# Patient Record
Sex: Female | Born: 1995
Health system: Southern US, Community
[De-identification: ages and names within clinical notes are randomized; demographics above are authoritative.]

## PROBLEM LIST (undated history)

## (undated) DIAGNOSIS — F329 Major depressive disorder, single episode, unspecified: Secondary | ICD-10-CM

## (undated) DIAGNOSIS — I10 Essential (primary) hypertension: Secondary | ICD-10-CM

## (undated) DIAGNOSIS — E119 Type 2 diabetes mellitus without complications: Secondary | ICD-10-CM

## (undated) DIAGNOSIS — F419 Anxiety disorder, unspecified: Secondary | ICD-10-CM

## (undated) DIAGNOSIS — F32A Depression, unspecified: Secondary | ICD-10-CM

## (undated) DIAGNOSIS — D649 Anemia, unspecified: Secondary | ICD-10-CM

---
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
## 2017-07-22 DIAGNOSIS — E1065 Type 1 diabetes mellitus with hyperglycemia: Secondary | ICD-10-CM | POA: Diagnosis not present

## 2017-07-22 DIAGNOSIS — E538 Deficiency of other specified B group vitamins: Secondary | ICD-10-CM | POA: Diagnosis not present

## 2017-07-22 DIAGNOSIS — E1029 Type 1 diabetes mellitus with other diabetic kidney complication: Secondary | ICD-10-CM | POA: Diagnosis not present

## 2017-07-22 DIAGNOSIS — E559 Vitamin D deficiency, unspecified: Secondary | ICD-10-CM | POA: Diagnosis not present

## 2017-07-22 DIAGNOSIS — E785 Hyperlipidemia, unspecified: Secondary | ICD-10-CM | POA: Diagnosis not present

## 2017-07-22 DIAGNOSIS — E782 Mixed hyperlipidemia: Secondary | ICD-10-CM | POA: Diagnosis not present

## 2017-07-29 DIAGNOSIS — E1065 Type 1 diabetes mellitus with hyperglycemia: Secondary | ICD-10-CM | POA: Diagnosis not present

## 2017-07-29 DIAGNOSIS — E1029 Type 1 diabetes mellitus with other diabetic kidney complication: Secondary | ICD-10-CM | POA: Diagnosis not present

## 2017-08-29 DIAGNOSIS — E1065 Type 1 diabetes mellitus with hyperglycemia: Secondary | ICD-10-CM | POA: Diagnosis not present

## 2017-08-29 DIAGNOSIS — E785 Hyperlipidemia, unspecified: Secondary | ICD-10-CM | POA: Diagnosis not present

## 2017-08-29 DIAGNOSIS — E1029 Type 1 diabetes mellitus with other diabetic kidney complication: Secondary | ICD-10-CM | POA: Diagnosis not present

## 2017-08-29 DIAGNOSIS — E559 Vitamin D deficiency, unspecified: Secondary | ICD-10-CM | POA: Diagnosis not present

## 2017-09-22 DIAGNOSIS — H5213 Myopia, bilateral: Secondary | ICD-10-CM | POA: Diagnosis not present

## 2017-09-22 DIAGNOSIS — E109 Type 1 diabetes mellitus without complications: Secondary | ICD-10-CM | POA: Diagnosis not present

## 2017-10-31 DIAGNOSIS — E1065 Type 1 diabetes mellitus with hyperglycemia: Secondary | ICD-10-CM | POA: Diagnosis not present

## 2017-10-31 DIAGNOSIS — E1029 Type 1 diabetes mellitus with other diabetic kidney complication: Secondary | ICD-10-CM | POA: Diagnosis not present

## 2017-11-07 DIAGNOSIS — E785 Hyperlipidemia, unspecified: Secondary | ICD-10-CM | POA: Diagnosis not present

## 2017-11-07 DIAGNOSIS — E1065 Type 1 diabetes mellitus with hyperglycemia: Secondary | ICD-10-CM | POA: Diagnosis not present

## 2017-11-07 DIAGNOSIS — E1029 Type 1 diabetes mellitus with other diabetic kidney complication: Secondary | ICD-10-CM | POA: Diagnosis not present

## 2017-11-07 DIAGNOSIS — E559 Vitamin D deficiency, unspecified: Secondary | ICD-10-CM | POA: Diagnosis not present

## 2017-11-11 DIAGNOSIS — F321 Major depressive disorder, single episode, moderate: Secondary | ICD-10-CM | POA: Diagnosis not present

## 2017-11-11 DIAGNOSIS — F4312 Post-traumatic stress disorder, chronic: Secondary | ICD-10-CM | POA: Diagnosis not present

## 2017-11-11 DIAGNOSIS — F411 Generalized anxiety disorder: Secondary | ICD-10-CM | POA: Diagnosis not present

## 2017-11-28 DIAGNOSIS — E1029 Type 1 diabetes mellitus with other diabetic kidney complication: Secondary | ICD-10-CM | POA: Diagnosis not present

## 2017-11-28 DIAGNOSIS — E559 Vitamin D deficiency, unspecified: Secondary | ICD-10-CM | POA: Diagnosis not present

## 2017-11-28 DIAGNOSIS — E1065 Type 1 diabetes mellitus with hyperglycemia: Secondary | ICD-10-CM | POA: Diagnosis not present

## 2017-11-28 DIAGNOSIS — E785 Hyperlipidemia, unspecified: Secondary | ICD-10-CM | POA: Diagnosis not present

## 2017-12-09 DIAGNOSIS — Z113 Encounter for screening for infections with a predominantly sexual mode of transmission: Secondary | ICD-10-CM | POA: Diagnosis not present

## 2017-12-09 DIAGNOSIS — F321 Major depressive disorder, single episode, moderate: Secondary | ICD-10-CM | POA: Diagnosis not present

## 2017-12-09 DIAGNOSIS — Z0001 Encounter for general adult medical examination with abnormal findings: Secondary | ICD-10-CM | POA: Diagnosis not present

## 2017-12-09 DIAGNOSIS — Z23 Encounter for immunization: Secondary | ICD-10-CM | POA: Diagnosis not present

## 2017-12-29 DIAGNOSIS — L700 Acne vulgaris: Secondary | ICD-10-CM | POA: Diagnosis not present

## 2017-12-29 DIAGNOSIS — L309 Dermatitis, unspecified: Secondary | ICD-10-CM | POA: Diagnosis not present

## 2018-01-12 DIAGNOSIS — D649 Anemia, unspecified: Secondary | ICD-10-CM | POA: Diagnosis not present

## 2018-01-12 DIAGNOSIS — E1029 Type 1 diabetes mellitus with other diabetic kidney complication: Secondary | ICD-10-CM | POA: Diagnosis not present

## 2018-01-12 DIAGNOSIS — E559 Vitamin D deficiency, unspecified: Secondary | ICD-10-CM | POA: Diagnosis not present

## 2018-01-12 DIAGNOSIS — E1065 Type 1 diabetes mellitus with hyperglycemia: Secondary | ICD-10-CM | POA: Diagnosis not present

## 2018-01-12 DIAGNOSIS — E785 Hyperlipidemia, unspecified: Secondary | ICD-10-CM | POA: Diagnosis not present

## 2018-02-07 DIAGNOSIS — E1065 Type 1 diabetes mellitus with hyperglycemia: Secondary | ICD-10-CM | POA: Diagnosis not present

## 2018-02-07 DIAGNOSIS — E1029 Type 1 diabetes mellitus with other diabetic kidney complication: Secondary | ICD-10-CM | POA: Diagnosis not present

## 2018-02-07 DIAGNOSIS — E785 Hyperlipidemia, unspecified: Secondary | ICD-10-CM | POA: Diagnosis not present

## 2018-02-07 DIAGNOSIS — E559 Vitamin D deficiency, unspecified: Secondary | ICD-10-CM | POA: Diagnosis not present

## 2018-02-23 DIAGNOSIS — Z01411 Encounter for gynecological examination (general) (routine) with abnormal findings: Secondary | ICD-10-CM | POA: Diagnosis not present

## 2018-02-23 DIAGNOSIS — Z113 Encounter for screening for infections with a predominantly sexual mode of transmission: Secondary | ICD-10-CM | POA: Diagnosis not present

## 2018-02-23 DIAGNOSIS — Z6836 Body mass index (BMI) 36.0-36.9, adult: Secondary | ICD-10-CM | POA: Diagnosis not present

## 2018-02-23 DIAGNOSIS — L293 Anogenital pruritus, unspecified: Secondary | ICD-10-CM | POA: Diagnosis not present

## 2018-07-26 DIAGNOSIS — N898 Other specified noninflammatory disorders of vagina: Secondary | ICD-10-CM | POA: Diagnosis not present

## 2018-07-26 DIAGNOSIS — N760 Acute vaginitis: Secondary | ICD-10-CM | POA: Diagnosis not present

## 2018-07-26 DIAGNOSIS — N926 Irregular menstruation, unspecified: Secondary | ICD-10-CM | POA: Diagnosis not present

## 2018-08-11 DIAGNOSIS — E785 Hyperlipidemia, unspecified: Secondary | ICD-10-CM | POA: Diagnosis not present

## 2018-08-11 DIAGNOSIS — E559 Vitamin D deficiency, unspecified: Secondary | ICD-10-CM | POA: Diagnosis not present

## 2018-08-11 DIAGNOSIS — E1029 Type 1 diabetes mellitus with other diabetic kidney complication: Secondary | ICD-10-CM | POA: Diagnosis not present

## 2018-08-11 DIAGNOSIS — E1065 Type 1 diabetes mellitus with hyperglycemia: Secondary | ICD-10-CM | POA: Diagnosis not present

## 2018-08-17 ENCOUNTER — Ambulatory Visit: Payer: Self-pay | Admitting: Nurse Practitioner

## 2018-08-17 VITALS — BP 118/78 | HR 97 | Temp 98.3°F | Resp 20 | Wt 186.6 lb

## 2018-08-17 DIAGNOSIS — H6592 Unspecified nonsuppurative otitis media, left ear: Secondary | ICD-10-CM

## 2018-08-17 MED ORDER — AMOXICILLIN 875 MG PO TABS: 875.0000 mg | ORAL_TABLET | Freq: Two times a day (BID) | ORAL | 0 refills | Status: AC

## 2018-08-17 MED ORDER — FLUTICASONE PROPIONATE 50 MCG/ACT NA SUSP: 2.0000 | Freq: Every day | NASAL | 0 refills | Status: AC

## 2018-08-17 NOTE — Progress Notes (Signed)
Subjective:    Patient ID: Zoe Fox, female    DOB: 1995/09/05, 23 y.o.   MRN: [id number]  The patient is a 23 year old female who presents today for complaints of left ear pain.  Patient states her symptoms started this morning when she woke up.  Patient states that she has been sick with a cold to include coughing and nasal congestion for about 2 weeks prior.  Patient states she has been taking Mucinex for her cold symptoms and cough which have seemed to help.  The patient denies fever, chills, sore throat, loss of hearing, or change in hearing.  Patient does admit to "pressure" in the left ear and feeling like her ears need to pop, along with decreased appetite.  Patient states she has been drinking plenty of fluids today.  Patient denies that she could be hyperglycemic at this time as she normally urinates when this occurs..  The patient rates the current left ear pain 9/10 at present.  Patient is a diabetic, but does not recall her last A1c as she states she just went to the doctor and she has not received her lab work back yet.  Patient states that she cannot recall her last A1c prior to this date as it is been a while as the office kept canceling her appointment.  Patient states her last blood sugar that she took on yesterday was 270.  Otalgia   There is pain in the left ear. This is a new problem. The current episode started today. The problem occurs constantly. There has been no fever. The pain is at a severity of 9/10. The pain is severe. Associated symptoms include coughing. Pertinent negatives include no abdominal pain, ear discharge, headaches, hearing loss, rhinorrhea or sore throat. She has tried nothing for the symptoms. history of recurrent ear infections as a child   No past medical history on file.    Review of Systems  Constitutional: Positive for appetite change and fatigue. Negative for activity change, chills, diaphoresis and fever.  HENT: Positive for congestion and ear  pain. Negative for ear discharge, hearing loss, postnasal drip, rhinorrhea, sinus pressure, sinus pain and sore throat.   Eyes: Negative.   Respiratory: Positive for cough. Negative for shortness of breath, wheezing and stridor.   Cardiovascular: Negative.   Gastrointestinal: Negative for abdominal pain.  Skin: Negative.   Allergic/Immunologic: Negative.   Neurological: Negative for dizziness, seizures, light-headedness, numbness and headaches.       Objective: Blood pressure 118/78, pulse 97, temperature 98.3 F (36.8 C), temperature source Oral, resp. rate 20, weight 186 lb 9.6 oz (84.6 kg), SpO2 98 %.   Physical Exam Constitutional:      Appearance: Normal appearance.     Comments: Appears uncomfortable due to left ear pain  HENT:     Head: Normocephalic.     Right Ear: Ear canal and external ear normal.     Left Ear: Hearing, ear canal and external ear normal. A middle ear effusion is present. Tympanic membrane is injected, erythematous and bulging.     Ears:     Comments: Left middle ear effusion with pus apppearing fluid, + erythematous TM Skin:    Findings: Rash: illin.  Neurological:     Mental Status: She is alert.       Assessment & Plan:   Exam findings, diagnosis etiology and medication use and indications reviewed with patient. Follow- Up and discharge instructions provided. No emergent/urgent issues found on exam.  Based on the  patient's clinical presentation, symptoms, and physical assessment, patient's findings were consistent with that of a left suppurative otitis media.  Patient had pus appearing fluid in the middle left ear, with extreme pain.  Also provided the patient with Flonase to help with her middle ear effusion.  Patient was also instructed to follow-up with her primary care for better management of her diabetes.  The patient education was provided. Patient verbalized understanding of information provided and agrees with plan of care (POC), all questions  answered. The patient is advised to call or return to clinic if condition does not see an improvement in symptoms, or to seek the care of the closest emergency department if condition worsens with the above plan.   1. Left otitis media with effusion  - amoxicillin (AMOXIL) 875 MG tablet; Take 1 tablet (875 mg total) by mouth 2 (two) times daily for 10 days.  Dispense: 20 tablet; Refill: 0 - fluticasone (FLONASE) 50 MCG/ACT nasal spray; Place 2 sprays into both nostrils daily for 10 days.  Dispense: 16 g; Refill: 0 -Take medications as prescribed. -Ibuprofen or Tylenol for pain, fever, or general discomfort. -Warm compresses to the left ear for pain or discomfort. -Follow-up in the emergency department if you have loss of hearing, change of hearing, or other concerns. -Follow-up with your regular doctor if you are not improved in the next 3 to 5 days. -Follow-up with your regular doctor to keep a check on your diabetes management.

## 2018-08-17 NOTE — Patient Instructions (Signed)
Otitis Media, Adult -Take medications as prescribed. -Ibuprofen or Tylenol for pain, fever, or general discomfort. -Warm compresses to the left ear for pain or discomfort. -Follow-up in the emergency department if you have loss of hearing, change of hearing, or other concerns. -Follow-up with your regular doctor if you are not improved in the next 3 to 5 days. -Follow-up with your regular doctor to keep a check on your diabetes management.  Otitis media occurs when there is inflammation and fluid in the middle ear. Your middle ear is a part of the ear that contains bones for hearing as well as air that helps send sounds to your brain. What are the causes? This condition is caused by a blockage in the eustachian tube. This tube drains fluid from the ear to the back of the nose (nasopharynx). A blockage in this tube can be caused by an object or by swelling (edema) in the tube. Problems that can cause a blockage include:  A cold or other upper respiratory infection.  Allergies.  An irritant, such as tobacco smoke.  Enlarged adenoids. The adenoids are areas of soft tissue located high in the back of the throat, behind the nose and the roof of the mouth.  A mass in the nasopharynx.  Damage to the ear caused by pressure changes (barotrauma). What are the signs or symptoms? Symptoms of this condition include:  Ear pain.  A fever.  Decreased hearing.  A headache.  Tiredness (lethargy).  Fluid leaking from the ear.  Ringing in the ear. How is this diagnosed? This condition is diagnosed with a physical exam. During the exam your health care provider will use an instrument called an otoscope to look into your ear and check for redness, swelling, and fluid. He or she will also ask about your symptoms. Your health care provider may also order tests, such as:  A test to check the movement of the eardrum (pneumatic otoscopy). This test is done by squeezing a small amount of air into the  ear.  A test that changes air pressure in the middle ear to check how well the eardrum moves and whether the eustachian tube is working (tympanogram). How is this treated? This condition usually goes away on its own within 3-5 days. But if the condition is caused by a bacteria infection and does not go away own its own, or keeps coming back, your health care provider may:  Prescribe antibiotic medicines to treat the infection.  Prescribe or recommend medicines to control pain. Follow these instructions at home:  Take over-the-counter and prescription medicines only as told by your health care provider.  If you were prescribed an antibiotic medicine, take it as told by your health care provider. Do not stop taking the antibiotic even if you start to feel better.  Keep all follow-up visits as told by your health care provider. This is important. Contact a health care provider if:  You have bleeding from your nose.  There is a lump on your neck.  You are not getting better in 5 days.  You feel worse instead of better. Get help right away if:  You have severe pain that is not controlled with medicine.  You have swelling, redness, or pain around your ear.  You have stiffness in your neck.  A part of your face is paralyzed.  The bone behind your ear (mastoid) is tender when you touch it.  You develop a severe headache. Summary  Otitis media is redness, soreness, and  swelling of the middle ear.  This condition usually goes away on its own within 3-5 days.  If the problem does not go away in 3-5 days, your health care provider may prescribe or recommend medicines to treat your symptoms.  If you were prescribed an antibiotic medicine, take it as told by your health care provider. This information is not intended to replace advice given to you by your health care provider. Make sure you discuss any questions you have with your health care provider. Document Released: 04/09/2004  Document Revised: 06/25/2016 Document Reviewed: 06/25/2016 Elsevier Interactive Patient Education  2019 [employer].

## 2019-03-08 DIAGNOSIS — Z01419 Encounter for gynecological examination (general) (routine) without abnormal findings: Secondary | ICD-10-CM | POA: Diagnosis not present

## 2019-03-08 DIAGNOSIS — Z6836 Body mass index (BMI) 36.0-36.9, adult: Secondary | ICD-10-CM | POA: Diagnosis not present

## 2019-06-27 ENCOUNTER — Inpatient Hospital Stay (HOSPITAL_COMMUNITY)
Admission: AD | Admit: 2019-06-27 | Discharge: 2019-06-27 | Disposition: A | Discharge status: Home / Self Care | Payer: BC Managed Care – PPO | Attending: Obstetrics and Gynecology | Admitting: Obstetrics and Gynecology

## 2019-06-27 ENCOUNTER — Inpatient Hospital Stay (HOSPITAL_COMMUNITY): Payer: BC Managed Care – PPO

## 2019-06-27 ENCOUNTER — Encounter (HOSPITAL_COMMUNITY): Payer: Self-pay

## 2019-06-27 ENCOUNTER — Other Ambulatory Visit: Payer: Self-pay

## 2019-06-27 DIAGNOSIS — Z3A01 Less than 8 weeks gestation of pregnancy: Secondary | ICD-10-CM | POA: Insufficient documentation

## 2019-06-27 DIAGNOSIS — Z794 Long term (current) use of insulin: Secondary | ICD-10-CM | POA: Insufficient documentation

## 2019-06-27 DIAGNOSIS — O4691 Antepartum hemorrhage, unspecified, first trimester: Secondary | ICD-10-CM | POA: Insufficient documentation

## 2019-06-27 DIAGNOSIS — E109 Type 1 diabetes mellitus without complications: Secondary | ICD-10-CM | POA: Diagnosis present

## 2019-06-27 DIAGNOSIS — O161 Unspecified maternal hypertension, first trimester: Secondary | ICD-10-CM | POA: Diagnosis not present

## 2019-06-27 DIAGNOSIS — E1065 Type 1 diabetes mellitus with hyperglycemia: Secondary | ICD-10-CM | POA: Diagnosis not present

## 2019-06-27 DIAGNOSIS — O3680X0 Pregnancy with inconclusive fetal viability, not applicable or unspecified: Secondary | ICD-10-CM | POA: Diagnosis not present

## 2019-06-27 DIAGNOSIS — O4690 Antepartum hemorrhage, unspecified, unspecified trimester: Secondary | ICD-10-CM

## 2019-06-27 DIAGNOSIS — O24011 Pre-existing diabetes mellitus, type 1, in pregnancy, first trimester: Secondary | ICD-10-CM | POA: Diagnosis not present

## 2019-06-27 DIAGNOSIS — Z79899 Other long term (current) drug therapy: Secondary | ICD-10-CM | POA: Insufficient documentation

## 2019-06-27 DIAGNOSIS — Z6790 Unspecified blood type, Rh positive: Secondary | ICD-10-CM | POA: Insufficient documentation

## 2019-06-27 DIAGNOSIS — O209 Hemorrhage in early pregnancy, unspecified: Secondary | ICD-10-CM | POA: Diagnosis not present

## 2019-06-27 HISTORY — DX: Anxiety disorder, unspecified: F41.9

## 2019-06-27 HISTORY — DX: Depression, unspecified: F32.A

## 2019-06-27 HISTORY — DX: Type 1 diabetes mellitus without complications: E10.9

## 2019-06-27 HISTORY — DX: Essential (primary) hypertension: I10

## 2019-06-27 HISTORY — DX: Anemia, unspecified: D64.9

## 2019-06-27 LAB — CBC WITH DIFFERENTIAL/PLATELET
Abs Immature Granulocytes: 0.01 10*3/uL (ref 0.00–0.07)
Basophils Absolute: 0 10*3/uL (ref 0.0–0.1)
Basophils Relative: 0 %
Eosinophils Absolute: 0 10*3/uL (ref 0.0–0.5)
Eosinophils Relative: 1 %
HCT: 36.1 % (ref 36.0–46.0)
Hemoglobin: 11.8 g/dL — ABNORMAL LOW (ref 12.0–15.0)
Immature Granulocytes: 0 %
Lymphocytes Relative: 31 %
Lymphs Abs: 2.2 10*3/uL (ref 0.7–4.0)
MCH: 25.3 pg — ABNORMAL LOW (ref 26.0–34.0)
MCHC: 32.7 g/dL (ref 30.0–36.0)
MCV: 77.3 fL — ABNORMAL LOW (ref 80.0–100.0)
Monocytes Absolute: 0.4 10*3/uL (ref 0.1–1.0)
Monocytes Relative: 5 %
Neutro Abs: 4.5 10*3/uL (ref 1.7–7.7)
Neutrophils Relative %: 63 %
Platelets: 377 10*3/uL (ref 150–400)
RBC: 4.67 MIL/uL (ref 3.87–5.11)
RDW: 12.9 % (ref 11.5–15.5)
WBC: 7.1 10*3/uL (ref 4.0–10.5)
nRBC: 0 % (ref 0.0–0.2)

## 2019-06-27 LAB — URINALYSIS, ROUTINE W REFLEX MICROSCOPIC
Bacteria, UA: NONE SEEN
Bilirubin Urine: NEGATIVE
Glucose, UA: >=500 mg/dL — AB
Ketones, ur: 20 mg/dL — AB
Leukocytes,Ua: NEGATIVE
Nitrite: NEGATIVE
Protein, ur: NEGATIVE mg/dL
Specific Gravity, Urine: 1.031 — ABNORMAL HIGH (ref 1.005–1.030)
pH: 6 (ref 5.0–8.0)

## 2019-06-27 LAB — COMPREHENSIVE METABOLIC PANEL
ALT: 13 U/L (ref 0–44)
AST: 12 U/L — ABNORMAL LOW (ref 15–41)
Albumin: 3.8 g/dL (ref 3.5–5.0)
Alkaline Phosphatase: 91 U/L (ref 38–126)
Anion gap: 15 (ref 5–15)
BUN: 8 mg/dL (ref 6–20)
CO2: 21 mmol/L — ABNORMAL LOW (ref 22–32)
Calcium: 9.1 mg/dL (ref 8.9–10.3)
Chloride: 98 mmol/L (ref 98–111)
Creatinine, Ser: 0.57 mg/dL (ref 0.44–1.00)
GFR calc Af Amer: >60 mL/min (ref >60)
GFR calc non Af Amer: >60 mL/min (ref >60)
Glucose, Bld: 529 mg/dL (ref 70–99)
Potassium: 3.5 mmol/L (ref 3.5–5.1)
Sodium: 134 mmol/L — ABNORMAL LOW (ref 135–145)
Total Bilirubin: 1 mg/dL (ref 0.3–1.2)
Total Protein: 7.1 g/dL (ref 6.5–8.1)

## 2019-06-27 LAB — ABO/RH: ABO/RH(D): O POS

## 2019-06-27 LAB — WET PREP, GENITAL
Clue Cells Wet Prep HPF POC: NONE SEEN
Sperm: NONE SEEN
Trich, Wet Prep: NONE SEEN
Yeast Wet Prep HPF POC: NONE SEEN

## 2019-06-27 LAB — GLUCOSE, CAPILLARY
Glucose-Capillary: 518 mg/dL (ref 70–99)
Glucose-Capillary: 396 mg/dL — ABNORMAL HIGH (ref 70–99)
Glucose-Capillary: 288 mg/dL — ABNORMAL HIGH (ref 70–99)

## 2019-06-27 LAB — POCT PREGNANCY, URINE: Preg Test, Ur: POSITIVE — AB

## 2019-06-27 LAB — HCG, QUANTITATIVE, PREGNANCY: hCG, Beta Chain, Quant, S: 481 m[IU]/mL — ABNORMAL HIGH (ref <5)

## 2019-06-27 MED ORDER — SODIUM CHLORIDE 0.9 % IV BOLUS
1000.0000 mL | Freq: Once | INTRAVENOUS | Status: AC
  Administered 2019-06-27: 1000 mL via INTRAVENOUS

## 2019-06-27 MED ORDER — INSULIN ASPART 100 UNIT/ML ~~LOC~~ SOLN
10.0000 [IU] | Freq: Once | SUBCUTANEOUS | Status: AC
  Administered 2019-06-27: 10 [IU] via SUBCUTANEOUS

## 2019-06-27 NOTE — MAU Provider Note (Signed)
History     CSN: [id number]  Arrival date and time: 06/27/19 1150   First Provider Initiated Contact with Patient 06/27/19 1235      Chief Complaint  Patient presents with  . Vaginal Bleeding   Ms. Zoe Fox is a 23 y.o. G1P0 at [redacted]w[redacted]d who presents to MAU for vaginal bleeding which began yesterday. Pt reports yesterday the bleeding was just pink spotting, but this morning is was "a little bit heavier than what it was with a brightish red tint to it."  Passing blood clots? no Blood soaking clothes? no Lightheaded/dizzy? no Significant pelvic pain or cramping? mild cramping Passed any tissue? no  Current pregnancy problems? pt has not yet been seen, Type I DM Blood Type? unknown Allergies? NKDA Current medications? Novolog, [place] Current [facility] & next appt? Pt does not have an OB/GYN in [facility]  Pt denies vaginal discharge/odor/itching. Pt denies N/V, abdominal pain, constipation, diarrhea, or urinary problems. Pt denies fever, chills, fatigue, sweating or changes in appetite. Pt denies SOB or chest pain. Pt denies dizziness, HA, light-headedness, weakness.   OB History    Gravida  1   Para      Term      Preterm      AB      Living        SAB      TAB      Ectopic      Multiple      Live Births              Past Medical History:  Diagnosis Date  . Anemia   . Anxiety   . Depression   . Diabetes mellitus without complication (HCC)   . Hypertension     History reviewed. No pertinent surgical history.  Family History  Problem Relation Age of Onset  . Hypertension Mother   . Cancer Maternal Grandfather     Social History   Tobacco Use  . Smoking status: Never Smoker  . Smokeless tobacco: Never Used  Substance Use Topics  . Alcohol use: Never    Frequency: Never  . Drug use: Never    Allergies: No Known Allergies  Medications Prior to Admission  Medication Sig Dispense Refill Last Dose  . insulin aspart (NOVOLOG) 100 UNIT/ML  injection Inject into the skin 3 (three) times daily before meals.   06/26/2019 at Unknown time  . insulin glargine (LANTUS) 100 UNIT/ML injection Inject into the skin daily.   06/26/2019 at Unknown time  . fluticasone (FLONASE) 50 MCG/ACT nasal spray Place 2 sprays into both nostrils daily for 10 days. 16 g 0   . insulin lispro (HUMALOG) 100 UNIT/ML injection Inject into the skin 3 (three) times daily before meals.     . vitamin A 40981 UNIT capsule Take 10,000 Units by mouth daily.       Review of Systems  Constitutional: Negative for chills, diaphoresis, fatigue and fever.  Eyes: Negative for visual disturbance.  Respiratory: Negative for shortness of breath.   Cardiovascular: Negative for chest pain.  Gastrointestinal: Negative for abdominal pain, constipation, diarrhea, nausea and vomiting.  Genitourinary: Positive for pelvic pain and vaginal bleeding. Negative for dysuria, flank pain, frequency, urgency and vaginal discharge.  Neurological: Negative for dizziness, weakness, light-headedness and headaches.   Physical Exam   Blood pressure (!) 141/89, pulse 96, temperature 98.6 F (37 C), temperature source Oral, resp. rate 16, height  (1.575 m), weight 67 kg, last menstrual period 05/17/2019, SpO2 100 %.  Patient Vitals for the past 24 hrs:  BP Temp Temp src Pulse Resp SpO2 Height Weight  06/27/19 1225 (!) 141/89 - - 96 - - - -  06/27/19 1203 (!) 156/100 98.6 F (37 C) Oral (!) 113 16 100 % 5\' 2"  (1.575 m) 67 kg   Physical Exam  Constitutional: She is oriented to person, place, and time. She appears well-developed and well-nourished. No distress.  HENT:  Head: Normocephalic and atraumatic.  Respiratory: Effort normal.  GI: Soft. She exhibits no distension and no mass. There is no abdominal tenderness. There is no rebound and no guarding.  Genitourinary: There is no rash, tenderness or lesion on the right labia. There is no rash, tenderness or lesion on the left labia. Uterus  is not enlarged and not tender. Cervix exhibits no motion tenderness, no discharge and no friability. Right adnexum displays no mass, no tenderness and no fullness. Left adnexum displays no mass, no tenderness and no fullness.    Vaginal bleeding (minimal blood present in vault, easily removed with single Fox swab, minimal blood comng from external os) present.     No vaginal discharge or tenderness.  There is bleeding (minimal blood present in vault, easily removed with single Fox swab, minimal blood comng from external os) in the vagina. No tenderness in the vagina.    Genitourinary Comments: Cervix closed.   Neurological: She is alert and oriented to person, place, and time.  Skin: Skin is warm and dry. She is not diaphoretic.  Psychiatric: She has a normal mood and affect. Her behavior is normal. Judgment and thought content normal.   Results for orders placed or performed during the hospital encounter of 06/27/19 (from the past 24 hour(s))  Urinalysis, Routine w reflex microscopic     Status: Abnormal   Collection Time: 06/27/19 12:10 PM  Result Value Ref Range   Color, Urine COLORLESS (A) YELLOW   APPearance CLEAR CLEAR   Specific Gravity, Urine 1.031 (H) 1.005 - 1.030   pH 6.0 5.0 - 8.0   Glucose, UA >=500 (A) NEGATIVE mg/dL   Hgb urine dipstick SMALL (A) NEGATIVE   Bilirubin Urine NEGATIVE NEGATIVE   Ketones, ur 20 (A) NEGATIVE mg/dL   Protein, ur NEGATIVE NEGATIVE mg/dL   Nitrite NEGATIVE NEGATIVE   Leukocytes,Ua NEGATIVE NEGATIVE   Bacteria, UA NONE SEEN NONE SEEN   Squamous Epithelial / LPF 0-5 0 - 5  Pregnancy, urine POC     Status: Abnormal   Collection Time: 06/27/19 12:12 PM  Result Value Ref Range   Preg Test, Ur POSITIVE (A) NEGATIVE  CBC with Differential/Platelet     Status: Abnormal   Collection Time: 06/27/19 12:49 PM  Result Value Ref Range   WBC 7.1 4.0 - 10.5 K/uL   RBC 4.67 3.87 - 5.11 MIL/uL   Hemoglobin 11.8 (L) 12.0 - 15.0 g/dL   HCT 14/09/20 16.0 - 10.9 %    MCV 77.3 (L) 80.0 - 100.0 fL   MCH 25.3 (L) 26.0 - 34.0 pg   MCHC 32.7 30.0 - 36.0 g/dL   RDW 32.3 55.7 - 32.2 %   Platelets 377 150 - 400 K/uL   nRBC 0.0 0.0 - 0.2 %   Neutrophils Relative % 63 %   Neutro Abs 4.5 1.7 - 7.7 K/uL   Lymphocytes Relative 31 %   Lymphs Abs 2.2 0.7 - 4.0 K/uL   Monocytes Relative 5 %   Monocytes Absolute 0.4 0.1 - 1.0 K/uL   Eosinophils Relative 1 %  Eosinophils Absolute 0.0 0.0 - 0.5 K/uL   Basophils Relative 0 %   Basophils Absolute 0.0 0.0 - 0.1 K/uL   Immature Granulocytes 0 %   Abs Immature Granulocytes 0.01 0.00 - 0.07 K/uL  Comprehensive metabolic panel     Status: Abnormal   Collection Time: 06/27/19 12:49 PM  Result Value Ref Range   Sodium 134 (L) 135 - 145 mmol/L   Potassium 3.5 3.5 - 5.1 mmol/L   Chloride 98 98 - 111 mmol/L   CO2 21 (L) 22 - 32 mmol/L   Glucose, Bld 529 (HH) 70 - 99 mg/dL   BUN 8 6 - 20 mg/dL   Creatinine, Ser 1.61 0.44 - 1.00 mg/dL   Calcium 9.1 8.9 - 09.6 mg/dL   Total Protein 7.1 6.5 - 8.1 g/dL   Albumin 3.8 3.5 - 5.0 g/dL   AST 12 (L) 15 - 41 U/L   ALT 13 0 - 44 U/L   Alkaline Phosphatase 91 38 - 126 U/L   Total Bilirubin 1.0 0.3 - 1.2 mg/dL   GFR calc non Af Amer >60 >60 mL/min   GFR calc Af Amer >60 >60 mL/min   Anion gap 15 5 - 15  ABO/Rh     Status: None   Collection Time: 06/27/19 12:49 PM  Result Value Ref Range   ABO/RH(D) O POS    No rh immune globuloin      NOT A RH IMMUNE GLOBULIN CANDIDATE, PT RH POSITIVE Performed at [facility], [address] [place], [place] [address]   hCG, quantitative, pregnancy     Status: Abnormal   Collection Time: 06/27/19 12:49 PM  Result Value Ref Range   hCG, Beta Chain, Quant, S 481 (H) <5 mIU/mL  Wet prep, genital     Status: Abnormal   Collection Time: 06/27/19 12:50 PM   Specimen: Vaginal  Result Value Ref Range   Yeast Wet Prep HPF POC NONE SEEN NONE SEEN   Trich, Wet Prep NONE SEEN NONE SEEN   Clue Cells Wet Prep HPF POC NONE SEEN NONE SEEN    WBC, Wet Prep HPF POC MANY (A) NONE SEEN   Sperm NONE SEEN   Glucose, capillary     Status: Abnormal   Collection Time: 06/27/19 12:57 PM  Result Value Ref Range   Glucose-Capillary 518 (HH) 70 - 99 mg/dL   Comment 1 Notify RN   Glucose, capillary     Status: Abnormal   Collection Time: 06/27/19  2:12 PM  Result Value Ref Range   Glucose-Capillary 396 (H) 70 - 99 mg/dL  Glucose, capillary     Status: Abnormal   Collection Time: 06/27/19  3:02 PM  Result Value Ref Range   Glucose-Capillary 288 (H) 70 - 99 mg/dL   US Ob Less Than 14 Weeks With Ob Transvaginal  Result Date: 06/27/2019 CLINICAL DATA:  First trimester pregnancy with vaginal bleeding. Positive urine pregnancy test. Quantitative beta HCG level pending. LMP 05/17/2019. EXAM: OBSTETRIC <14 WK [place] AND TRANSVAGINAL OB US TECHNIQUE: Both transabdominal and transvaginal ultrasound examinations were performed for complete evaluation of the gestation as well as the maternal uterus, adnexal regions, and pelvic cul-de-sac. Transvaginal technique was performed to assess early pregnancy. COMPARISON:  None. FINDINGS: Intrauterine gestational sac: Small cystic area within the endometrial canal likely represents a gestational sac. This measures 2.5 x 2.5 x 3.8 cm. Yolk sac:  Probable small yolk sac. Embryo:  Not visualized. Cardiac Activity: Not visualized. MSD: 2.9 mm  4 w   6 d Subchorionic hemorrhage:  None visualized. Maternal uterus/adnexae: The right ovary is not visualized. Small left corpus luteal and simple left ovarian cysts. Trace free pelvic fluid. IMPRESSION: 1. Probable early intrauterine gestational sac with a yolk sac but no fetal pole or cardiac activity yet visualized to confirm viability. Recommend follow-up quantitative B-HCG levels and follow-up US in 14 days to assess viability. This recommendation follows SRU consensus guidelines: Diagnostic Criteria for Nonviable Pregnancy Early in the First Trimester. [place] Med 2013;  151:7616-07. 2. No apparent acute findings. Electronically Signed   By: Richardean Sale M.D.   On: 06/27/2019 14:04    MAU Course  Procedures  MDM -r/o ectopic -CBG: 518 - ordered as patient is Type I DM and reported nothing to eat since yesterday; Novolog 10U given, recheck 396 -elevated pressure, pt denies symptoms and denies hx of HTN, but states that her BP is elevated when she gets nervous and she expresses high levels of anxiety about the pregnancy today -UA: colorless/SG1.031/>500GLU/sm hgb/20ketones -CBC: WNL for pregnancy -CMP: NA 134, glucose 529 -[place]: possible gestational sac, possible yolk sac, no embryo, [redacted]w[redacted]d, trace pelvic free fluid -hCG: 481 -ABO: O Positive -WetPrep: WNL -GC/CT collected -consulted with Dr. Elly Modena @218PM . Pt to have another 10U Novolog, eat and give a bolus of normal saline. Pt also to be advised on risks of fetal malformation and increased risk of miscarriage in first trimester with elevated blood sugar. -recheck of BS 53min after 2nd injection of Novolog and eating, 288 -consulted with Dr. Elly Modena @306PM , pt OK to be discharged home and resume original insulin regimen. -pt discharged to home in stable condition  Orders Placed This Encounter  Procedures  . Wet prep, genital    Standing Status:   Standing    Number of Occurrences:   1  . US OB LESS THAN 14 WEEKS WITH OB TRANSVAGINAL    Standing Status:   Standing    Number of Occurrences:   1    Order Specific Question:   Symptom/Reason for Exam    Answer:   Vaginal bleeding in pregnancy [371062]  . Urinalysis, Routine w reflex microscopic    Standing Status:   Standing    Number of Occurrences:   1  . CBC with Differential/Platelet    Standing Status:   Standing    Number of Occurrences:   1  . Comprehensive metabolic panel    Standing Status:   Standing    Number of Occurrences:   1  . Glucose, capillary    Standing Status:   Standing    Number of Occurrences:   1  . hCG, quantitative,  pregnancy    Standing Status:   Standing    Number of Occurrences:   1  . Glucose, capillary    Standing Status:   Standing    Number of Occurrences:   1  . Glucose, capillary    Standing Status:   Standing    Number of Occurrences:   1  . Ambulatory referral to Endocrinology    Referral Priority:   Urgent    Referral Type:   Consultation    Referral Reason:   Specialty Services Required    Number of Visits Requested:   1  . Pregnancy, urine POC    Standing Status:   Standing    Number of Occurrences:   1  . ABO/Rh    Standing Status:   Standing    Number of  Occurrences:   1  . Insert peripheral IV    Standing Status:   Standing    Number of Occurrences:   1  . Discharge patient    Order Specific Question:   Discharge disposition    Answer:   01-Home or Self Care [1]    Order Specific Question:   Discharge patient date    Answer:   06/27/2019   Assessment and Plan   1. Pregnancy of unknown anatomic location   2. Vaginal bleeding in pregnancy   3. Blood type, Rh positive   4. Uncontrolled type 1 diabetes mellitus with hyperglycemia (HCC)   5. Type 1 diabetes mellitus with hyperglycemia (HCC)    Allergies as of 06/27/2019   No Known Allergies     Medication List    TAKE these medications   fluticasone 50 MCG/ACT nasal spray Commonly known as: FLONASE Place 2 sprays into both nostrils daily for 10 days.   insulin aspart 100 UNIT/ML injection Commonly known as: novoLOG Inject into the skin 3 (three) times daily before meals.   insulin glargine 100 UNIT/ML injection Commonly known as: LANTUS Inject into the skin daily.   insulin lispro 100 UNIT/ML injection Commonly known as: HUMALOG Inject into the skin 3 (three) times daily before meals.   vitamin A 1610910000 UNIT capsule Take 10,000 Units by mouth daily.      -will call with culture results, if positive -pt to call endo re: pregnancy to discuss possible change in management for Type I DM; referral entered for  outpatient endocrinology -discussed possible complications in pregnancy with elevated blood sugar -pt advised to return to insulin regimen as previously prescribed and to not skip meals to adequately control blood sugar -pt advised on risks of fetal malformation and increased risk of miscarriage in first trimester with elevated blood sugar. -message sent to [facility] to schedule for stat hCG on Friday 06/29/2019 AM -strict ectopic/bleeding/pain/return MAU precautions given -pt discharged to home in stable condition  Joni Reiningicole E  06/27/2019, 3:22 PM

## 2019-06-27 NOTE — MAU Note (Signed)
.   Zoe Fox is a 23 y.o. at [redacted]w[redacted]d here in MAU reporting: vaginal bleeding that started yesterday. It has increased into period-like bright red bleeding, but does not fill a pad. She states that she has cramping in her back when standing or sitting.  LMP: 05/17/2019 Onset of complaint: 06/26/19 @1100  Pain score: 4 Vitals:   06/27/19 1203  BP: (!) 156/100  Pulse: (!) 113  Resp: 16  Temp: 98.6 F (37 C)  SpO2: 100%

## 2019-06-27 NOTE — Discharge Instructions (Signed)
Miscarriage °A miscarriage is the loss of an unborn baby (fetus) before the 20th week of pregnancy. Most miscarriages happen during the first 3 months of pregnancy. Sometimes, a miscarriage can happen before a woman knows that she is pregnant. °Having a miscarriage can be an emotional experience. If you have had a miscarriage, talk with your health care provider about any questions you may have about miscarrying, the grieving process, and your plans for future pregnancy. °What are the causes? °A miscarriage may be caused by: °· Problems with the genes or chromosomes of the fetus. These problems make it impossible for the baby to develop normally. They are often the result of random errors that occur early in the development of the baby, and are not passed from parent to child (not inherited). °· Infection of the cervix or uterus. °· Conditions that affect hormone balance in the body. °· Problems with the cervix, such as the cervix opening and thinning before pregnancy is at term (cervical insufficiency). °· Problems with the uterus. These may include: °? A uterus with an abnormal shape. °? Fibroids in the uterus. °? Congenital abnormalities. These are problems that were present at birth. °· Certain medical conditions. °· Smoking, drinking alcohol, or using drugs. °· Injury (trauma). °In many cases, the cause of a miscarriage is not known. °What are the signs or symptoms? °Symptoms of this condition include: °· Vaginal bleeding or spotting, with or without cramps or pain. °· Pain or cramping in the abdomen or lower back. °· Passing fluid, tissue, or blood clots from the vagina. °How is this diagnosed? °This condition may be diagnosed based on: °· A physical exam. °· Ultrasound. °· Blood tests. °· Urine tests. °How is this treated? °Treatment for a miscarriage is sometimes not necessary if you naturally pass all the tissue that was in your uterus. If necessary, this condition may be treated with: °· Dilation and  curettage (D&C). This is a procedure in which the cervix is stretched open and the lining of the uterus (endometrium) is scraped. This is done only if tissue from the fetus or placenta remains in the body (incomplete miscarriage). °· Medicines, such as: °? Antibiotic medicine, to treat infection. °? Medicine to help the body pass any remaining tissue. °? Medicine to reduce (contract) the size of the uterus. These medicines may be given if you have a lot of bleeding. °If you have Rh negative blood and your baby was Rh positive, you will need a shot of a medicine called Rh immunoglobulinto protect your future babies from Rh blood problems. "Rh-negative" and "Rh-positive" refer to whether or not the blood has a specific protein found on the surface of red blood cells (Rh factor). °Follow these instructions at home: °Medicines ° °· Take over-the-counter and prescription medicines only as told by your health care provider. °· If you were prescribed antibiotic medicine, take it as told by your health care provider. Do not stop taking the antibiotic even if you start to feel better. °· Do not take NSAIDs, such as aspirin and ibuprofen, unless they are approved by your health care provider. These medicines can cause bleeding. °Activity °· Rest as directed. Ask your health care provider what activities are safe for you. °· Have someone help with home and family responsibilities during this time. °General instructions °· Keep track of the number of sanitary pads you use each day and how soaked (saturated) they are. Write down this information. °· Monitor the amount of tissue or blood clots that   you pass from your vagina. Save any large amounts of tissue for your health care provider to examine.  Do not use tampons, douche, or have sex until your health care provider approves.  To help you and your partner with the process of grieving, talk with your health care provider or seek counseling.  When you are ready, meet with  your health care provider to discuss any important steps you should take for your health. Also, discuss steps you should take to have a healthy pregnancy in the future.  Keep all follow-up visits as told by your health care provider. This is important. Where to find more information  The American Congress of Obstetricians and Gynecologists: www.acog.org  [place] Department of Health and [employer] Office of Womens Health: http://hoffman.com/www.womenshealth.gov Contact a health care provider if:  You have a fever or chills.  You have a foul smelling vaginal discharge.  You have more bleeding instead of less. Get help right away if:  You have severe cramps or pain in your back or abdomen.  You pass blood clots or tissue from your vagina that is walnut-sized or larger.  You soak more than 1 regular sanitary pad in an hour.  You become light-headed or weak.  You pass out.  You have feelings of sadness that take over your thoughts, or you have thoughts of hurting yourself. Summary  Most miscarriages happen in the first 3 months of pregnancy. Sometimes miscarriage happens before a woman even knows that she is pregnant.  Follow your health care provider's instruction for home care. Keep all follow-up appointments.  To help you and your partner with the process of grieving, talk with your health care provider or seek counseling. This information is not intended to replace advice given to you by your health care provider. Make sure you discuss any questions you have with your health care provider. Document Released: 12/29/2000 Document Revised: 10/27/2018 Document Reviewed: 08/10/2016 Elsevier Patient Education  2020 Elsevier Inc. Ectopic Pregnancy  An ectopic pregnancy is when the fertilized egg attaches (implants) outside the uterus. Most ectopic pregnancies occur in one of the tubes where eggs travel from the ovary to the uterus (fallopian tubes), but the implanting can occur in other locations.  In rare cases, ectopic pregnancies occur on the ovary, intestine, pelvis, abdomen, or cervix. In an ectopic pregnancy, the fertilized egg does not have the ability to develop into a normal, healthy baby. A ruptured ectopic pregnancy is one in which tearing or bursting of a fallopian tube causes internal bleeding. Often, there is intense lower abdominal pain, and vaginal bleeding sometimes occurs. Having an ectopic pregnancy can be life-threatening. If this dangerous condition is not treated, it can lead to blood loss, shock, or even death. What are the causes? The most common cause of this condition is damage to one of the fallopian tubes. A fallopian tube may be narrowed or blocked, and that keeps the fertilized egg from reaching the uterus. What increases the risk? This condition is more likely to develop in women of childbearing age who have different levels of risk. The levels of risk can be divided into three categories. High risk  You have gone through infertility treatment.  You have had an ectopic pregnancy before.  You have had surgery on the fallopian tubes, or another surgical procedure, such as an abortion.  You have had surgery to have the fallopian tubes tied (tubal ligation).  You have problems or diseases of the fallopian tubes.  You have been  exposed to diethylstilbestrol (DES). This medicine was used until 1971, and it had effects on babies whose mothers took the medicine.  You become pregnant while using an IUD (intrauterine device) for birth control. Moderate risk  You have a history of infertility.  You have had an STI (sexually transmitted infection).  You have a history of pelvic inflammatory disease (PID).  You have scarring from endometriosis.  You have multiple sexual partners.  You smoke. Low risk  You have had pelvic surgery.  You use vaginal douches.  You became sexually active before age 27. What are the signs or symptoms? Common symptoms of  this condition include normal pregnancy symptoms, such as missing a period, nausea, tiredness, abdominal pain, breast tenderness, and bleeding. However, ectopic pregnancy will have additional symptoms, such as:  Pain with intercourse.  Irregular vaginal bleeding or spotting.  Cramping or pain on one side or in the lower abdomen.  Fast heartbeat, low blood pressure, and sweating.  Passing out while having a bowel movement. Symptoms of a ruptured ectopic pregnancy and internal bleeding may include:  Sudden, severe pain in the abdomen and pelvis.  Dizziness, weakness, light-headedness, or fainting.  Pain in the shoulder or neck area. How is this diagnosed? This condition is diagnosed by:  A pelvic exam to locate pain or a mass in the abdomen.  A pregnancy test. This blood test checks for the presence as well as the specific level of pregnancy hormone in the bloodstream.  Ultrasound. This is performed if a pregnancy test is positive. In this test, a probe is inserted into the vagina. The probe will detect a fetus, possibly in a location other than the uterus.  Taking a sample of uterus tissue (dilation and curettage, or D&C).  Surgery to perform a visual exam of the inside of the abdomen using a thin, lighted tube that has a tiny camera on the end (laparoscope).  Culdocentesis. This procedure involves inserting a needle at the top of the vagina, behind the uterus. If blood is present in this area, it may indicate that a fallopian tube is torn. How is this treated? This condition is treated with medicine or surgery. Medicine  An injection of a medicine (methotrexate) may be given to cause the pregnancy tissue to be absorbed. This medicine may save your fallopian tube. It may be given if: ? The diagnosis is made early, with no signs of active bleeding. ? The fallopian tube has not ruptured. ? You are considered to be a good candidate for the medicine. Usually, pregnancy hormone  blood levels are checked after methotrexate treatment. This is to be sure that the medicine is effective. It may take 4-6 weeks for the pregnancy to be absorbed. Most pregnancies will be absorbed by 3 weeks. Surgery  A laparoscope may be used to remove the pregnancy tissue.  If severe internal bleeding occurs, a larger cut (incision) may be made in the lower abdomen (laparotomy) to remove the fetus and placenta. This is done to stop the bleeding.  Part or all of the fallopian tube may be removed (salpingectomy) along with the fetus and placenta. The fallopian tube may also be repaired during the surgery.  In very rare circumstances, removal of the uterus (hysterectomy) may be required.  After surgery, pregnancy hormone testing may be done to be sure that there is no pregnancy tissue left. Whether your treatment is medicine or surgery, you may receive a Rho (D) immune globulin shot to prevent problems with any future pregnancy.  This shot may be given if:  You are Rh-negative and the baby's father is Rh-positive.  You are Rh-negative and you do not know the Rh type of the baby's father. Follow these instructions at home:  Rest and limit your activity after the procedure for as long as told by your health care provider.  Until your health care provider says that it is safe: ? Do not lift anything that is heavier than 10 lb (4.5 kg), or the limit that your health care provider tells you. ? Avoid physical exercise and any movement that requires effort (is strenuous).  To help prevent constipation: ? Eat a healthy diet that includes fruits, vegetables, and whole grains. ? Drink 6-8 glasses of water per day. Get help right away if:  You develop worsening pain that is not relieved by medicine.  You have: ? A fever or chills. ? Vaginal bleeding. ? Redness and swelling at the incision site. ? Nausea and vomiting.  You feel dizzy or weak.  You feel light-headed or you faint. This  information is not intended to replace advice given to you by your health care provider. Make sure you discuss any questions you have with your health care provider. Document Released: 08/12/2004 Document Revised: 06/17/2017 Document Reviewed: 02/04/2016 Elsevier Patient Education  2020 [employer].                  Safe Medications in Pregnancy    Acne: Benzoyl Peroxide Salicylic Acid  Backache/Headache: Tylenol: 2 regular strength every 4 hours OR              2 Extra strength every 6 hours  Colds/Coughs/Allergies: Benadryl (alcohol free) 25 mg every 6 hours as needed Breath right strips Claritin Cepacol throat lozenges Chloraseptic throat spray Cold-Eeze- up to three times per day Cough drops, alcohol free Flonase (by prescription only) Guaifenesin Mucinex Robitussin DM (plain only, alcohol free) Saline nasal spray/drops Sudafed (pseudoephedrine) & Actifed ** use only after [redacted] weeks gestation and if you do not have high blood pressure Tylenol Vicks Vaporub Zinc lozenges Zyrtec   Constipation: Colace Ducolax suppositories Fleet enema Glycerin suppositories Metamucil Milk of magnesia Miralax Senokot Smooth move tea  Diarrhea: Kaopectate Imodium A-D  *NO pepto Bismol  Hemorrhoids: Anusol Anusol HC Preparation H Tucks  Indigestion: Tums Maalox Mylanta Zantac  Pepcid  Insomnia: Benadryl (alcohol free)  every 6 hours as needed Tylenol PM Unisom, no Gelcaps  Leg Cramps: Tums MagGel  Nausea/Vomiting:  Bonine Dramamine Emetrol Ginger extract Sea bands Meclizine  Nausea medication to take during pregnancy:  Unisom (doxylamine succinate 25 mg tablets) Take one tablet daily at bedtime. If symptoms are not adequately controlled, the dose can be increased to a maximum recommended dose of two tablets daily (1/2 tablet in the morning, 1/2 tablet mid-afternoon and one at bedtime). Vitamin B6  tablets. Take one tablet twice a day (up to 200  mg per day).  Skin Rashes: Aveeno products Benadryl cream or  every 6 hours as needed Calamine Lotion 1% cortisone cream  Yeast infection: Gyne-lotrimin 7 Monistat 7   **If taking multiple medications, please check labels to avoid duplicating the same active ingredients **take medication as directed on the label ** Do not exceed 4000 mg of tylenol in 24 hours **Do not take medications that contain aspirin or ibuprofen    [facility]     Phone: [phone]  [facility] [employer] at [facility]  Phone: [phone]  [facility] for [place] [facility] [place]  Phone: [place] [facility] [place] [facility] [place]                           Phone: [place] [facility] [place] at [facility]          Phone: [phone]  [facility]    Phone: [phone]   [facility])    Phone: [facility]    Phone: [phone]  [facility]    Phone: [place]    Phone: [phone]  [place] [facility]  Phone: [place]               Phone: [phone]  [facility]   Phone: [phone]  [facility]    Phone: [phone]

## 2019-06-28 ENCOUNTER — Inpatient Hospital Stay (HOSPITAL_COMMUNITY)
Admission: AD | Admit: 2019-06-28 | Discharge: 2019-06-28 | Disposition: A | Discharge status: Home / Self Care | Payer: BC Managed Care – PPO | Attending: Family Medicine | Admitting: Family Medicine

## 2019-06-28 ENCOUNTER — Encounter (HOSPITAL_COMMUNITY): Payer: Self-pay | Admitting: Family Medicine

## 2019-06-28 ENCOUNTER — Inpatient Hospital Stay (HOSPITAL_COMMUNITY): Payer: BC Managed Care – PPO

## 2019-06-28 DIAGNOSIS — O161 Unspecified maternal hypertension, first trimester: Secondary | ICD-10-CM | POA: Insufficient documentation

## 2019-06-28 DIAGNOSIS — Z3A01 Less than 8 weeks gestation of pregnancy: Secondary | ICD-10-CM | POA: Insufficient documentation

## 2019-06-28 DIAGNOSIS — O24911 Unspecified diabetes mellitus in pregnancy, first trimester: Secondary | ICD-10-CM | POA: Insufficient documentation

## 2019-06-28 DIAGNOSIS — O208 Other hemorrhage in early pregnancy: Secondary | ICD-10-CM | POA: Insufficient documentation

## 2019-06-28 DIAGNOSIS — Z794 Long term (current) use of insulin: Secondary | ICD-10-CM | POA: Insufficient documentation

## 2019-06-28 DIAGNOSIS — Z8249 Family history of ischemic heart disease and other diseases of the circulatory system: Secondary | ICD-10-CM | POA: Diagnosis not present

## 2019-06-28 DIAGNOSIS — O209 Hemorrhage in early pregnancy, unspecified: Secondary | ICD-10-CM | POA: Diagnosis present

## 2019-06-28 DIAGNOSIS — O26891 Other specified pregnancy related conditions, first trimester: Secondary | ICD-10-CM | POA: Insufficient documentation

## 2019-06-28 DIAGNOSIS — O200 Threatened abortion: Secondary | ICD-10-CM | POA: Insufficient documentation

## 2019-06-28 DIAGNOSIS — Z79899 Other long term (current) drug therapy: Secondary | ICD-10-CM | POA: Diagnosis not present

## 2019-06-28 DIAGNOSIS — M549 Dorsalgia, unspecified: Secondary | ICD-10-CM | POA: Diagnosis not present

## 2019-06-28 DIAGNOSIS — O4691 Antepartum hemorrhage, unspecified, first trimester: Secondary | ICD-10-CM | POA: Diagnosis not present

## 2019-06-28 DIAGNOSIS — O4690 Antepartum hemorrhage, unspecified, unspecified trimester: Secondary | ICD-10-CM

## 2019-06-28 LAB — GC/CHLAMYDIA PROBE AMP (~~LOC~~) NOT AT ARMC
Chlamydia: NEGATIVE
Comment: NEGATIVE
Comment: NORMAL
Neisseria Gonorrhea: NEGATIVE

## 2019-06-28 NOTE — Discharge Instructions (Signed)
Threatened Miscarriage  A threatened miscarriage occurs when a woman has vaginal bleeding during the first 20 weeks of pregnancy but the pregnancy has not ended. If you have vaginal bleeding during this time, your health care provider will do tests to make sure you are still pregnant. If the tests show that you are still pregnant and that the developing baby (fetus) inside your uterus is still growing, your condition is considered a threatened miscarriage. A threatened miscarriage does not mean your pregnancy will end, but it does increase the risk of losing your pregnancy (complete miscarriage). What are the causes? The cause of this condition is usually not known. For women who go on to have a complete miscarriage, the most common cause is an abnormal number of chromosomes in the developing baby. Chromosomes are the structures inside cells that hold all of a person's genetic material. What increases the risk? The following lifestyle factors may increase your risk of a miscarriage in early pregnancy:  Smoking.  Drinking excessive amounts of alcohol or caffeine.  Recreational drug use. The following preexisting health conditions may increase your risk of a miscarriage in early pregnancy:  Polycystic ovary syndrome.  Uterine fibroids.  Infections.  Diabetes mellitus. What are the signs or symptoms? Symptoms of this condition include:  Vaginal bleeding.  Mild abdominal pain or cramps. How is this diagnosed? If you have bleeding with or without abdominal pain before 20 weeks of pregnancy, your health care provider will do tests to check whether you are still pregnant. These will include:  Ultrasound. This test uses sound waves to create images of the inside of your uterus. This allows your health care provider to look at your developing baby and other structures, such as your placenta.  Pelvic exam. This is an internal exam of your vagina and cervix.  Measurement of your baby's heart  rate.  Laboratory tests such as blood tests, urine tests, or swabs for infection You may be diagnosed with a threatened miscarriage if:  Ultrasound testing shows that you are still pregnant.  Your baby's heart rate is strong.  A pelvic exam shows that the opening between your uterus and your vagina (cervix) is closed.  Blood tests confirm that you are still pregnant. How is this treated? No treatments have been shown to prevent a threatened miscarriage from going on to a complete miscarriage. However, the right home care is important. Follow these instructions at home:  Get plenty of rest.  Do not have sex or use tampons if you have vaginal bleeding.  Do not douche.  Do not smoke or use recreational drugs.  Do not drink alcohol.  Avoid caffeine.  Keep all follow-up prenatal visits as told by your health care provider. This is important. Contact a health care provider if:  You have light vaginal bleeding or spotting while pregnant.  You have abdominal pain or cramping.  You have a fever. Get help right away if:  You have heavy vaginal bleeding.  You have blood clots coming from your vagina.  You pass tissue from your vagina.  You leak fluid, or you have a gush of fluid from your vagina.  You have severe low back pain or abdominal cramps.  You have fever, chills, and severe abdominal pain. Summary  A threatened miscarriage occurs when a woman has vaginal bleeding during the first 20 weeks of pregnancy but the pregnancy has not ended.  The cause of a threatened miscarriage is usually not known.  Symptoms of this condition may   include vaginal bleeding and mild abdominal pain or cramps.  No treatments have been shown to prevent a threatened miscarriage from going on to a complete miscarriage.  Keep all follow-up prenatal visits as told by your health care provider. This is important. This information is not intended to replace advice given to you by your health  care provider. Make sure you discuss any questions you have with your health care provider. Document Released: 07/05/2005 Document Revised: 08/11/2017 Document Reviewed: 10/01/2016 Elsevier Patient Education  2020 Elsevier Inc.  

## 2019-06-28 NOTE — MAU Note (Signed)
Down time -- pt to u/s and pt returned from u/s at [place]

## 2019-06-28 NOTE — MAU Provider Note (Signed)
Late Entry d/t Downtime History     CSN: [id number]  Arrival date and time: 06/28/19 0122   None     Chief Complaint  Patient presents with  . Vaginal Bleeding  . Back Pain   Zoe Fox is a 23 y.o. G1P0 at [redacted]w[redacted]d by Definite LMP of 05/17/2019.  She presents today for Vaginal Bleeding and Back Pain.  Patient reports she had an evaluation this morning due to vaginal bleeding.  She was told to report to [facility] on Friday for repeat labs after the ultrasound showed questionable IUGS and hCG was 481.  Patient reports she started having bleeding with clots around 1pm and passed a questionable sac.  She states she only noticed the blood with wiping and has had no bleeding since arrival or any pain.  The patient is tearful and reports that she is scared and wants to make sure everything is okay.      OB History    Gravida  1   Para      Term      Preterm      AB      Living        SAB      TAB      Ectopic      Multiple      Live Births              Past Medical History:  Diagnosis Date  . Anemia   . Anxiety   . Depression   . Diabetes mellitus without complication ([place])   . Hypertension     History reviewed. No pertinent surgical history.  Family History  Problem Relation Age of Onset  . Hypertension Mother   . Cancer Maternal Grandfather     Social History   Tobacco Use  . Smoking status: Never Smoker  . Smokeless tobacco: Never Used  Substance Use Topics  . Alcohol use: Never  . Drug use: Never    Allergies: No Known Allergies  Medications Prior to Admission  Medication Sig Dispense Refill Last Dose  . fluticasone (FLONASE) 50 MCG/ACT nasal spray Place 2 sprays into both nostrils daily for 10 days. 16 g 0   . insulin aspart (NOVOLOG) 100 UNIT/ML injection Inject into the skin 3 (three) times daily before meals.     . insulin glargine (LANTUS) 100 UNIT/ML injection Inject into the skin daily.     . insulin lispro (HUMALOG) 100 UNIT/ML  injection Inject into the skin 3 (three) times daily before meals.     . vitamin A 10000 UNIT capsule Take 10,000 Units by mouth daily.       Review of Systems  Genitourinary: Positive for vaginal bleeding. Negative for difficulty urinating and dysuria.  Musculoskeletal: Positive for back pain.   Physical Exam   Blood pressure 140/84, pulse (!) 101, temperature 98.2 F (36.8 C), resp. rate 20, height 5\' 2"  (1.575 m), weight 69.9 kg, last menstrual period 05/17/2019.  Physical Exam  Constitutional: She is oriented to person, place, and time. She appears well-developed and well-nourished. She appears distressed.  HENT:  Head: Normocephalic and atraumatic.  Eyes: Conjunctivae are normal.  Cardiovascular: Normal rate.  Respiratory: Effort normal.  Genitourinary: Cervix exhibits discharge.    Vaginal bleeding present.  There is bleeding in the vagina.    Genitourinary Comments: Speculum Exam: -Normal External Genitalia: Non tender, no apparent discharge at introitus.  -Vaginal Vault: Pink mucosa with good rugae. Small amt dark red blood removed with faux  swab x 2 -Cervix: Pink, no lesions, cysts, or polyps.  Appears closed. Scant amt bleeding and questionable tissue from os -Bimanual Exam:  Deferred    Musculoskeletal:        General: No edema. Normal range of motion.     Cervical back: Normal range of motion.  Neurological: She is alert and oriented to person, place, and time.  Skin: Skin is warm and dry.  Psychiatric: She has a normal mood and affect. Her behavior is normal.    MAU Course  Procedures US OB Transvaginal  Result Date: 06/28/2019 CLINICAL DATA:  Pregnant patient in first-trimester pregnancy with vaginal bleeding. EXAM: TRANSVAGINAL OB ULTRASOUND TECHNIQUE: Transvaginal ultrasound was performed for complete evaluation of the gestation as well as the maternal uterus, adnexal regions, and pelvic cul-de-sac. COMPARISON:  Obstetric ultrasound yesterday, proximally 13  hours prior. FINDINGS: Intrauterine gestational sac: Single Yolk sac:  Not definitively seen. Embryo:  Not Visualized. Cardiac Activity: Not Visualized. MSD: 2.4 mm   4 w   6 d Subchorionic hemorrhage:  Small measuring 8 x 6 x 4 mm. Maternal uterus/adnexae: Left ovary measures 3.8 x 2.2 x 3.6 cm and contains a corpus luteum. The right ovary appears normal. No adnexal mass. No pelvic free fluid. IMPRESSION: 1. Probable early intrauterine gestational sac, the faint yolk sac on exam yesterday is not definitively seen. No fetal pole or cardiac activity yet visualized. Recommend follow-up quantitative B-HCG levels and follow-up US in 14 days to assess viability. This recommendation follows SRU consensus guidelines: Diagnostic Criteria for Nonviable Pregnancy Early in the First Trimester. Malva Limes Med 2013; 841:6606-30. 2. Small subchorionic hemorrhage has developed since exam yesterday. Electronically Signed   By: Narda Rutherford M.D.   On: 06/28/2019 03:15   US OB LESS THAN 14 WEEKS WITH OB TRANSVAGINAL  Result Date: 06/27/2019 CLINICAL DATA:  First trimester pregnancy with vaginal bleeding. Positive urine pregnancy test. Quantitative beta HCG level pending. LMP 05/17/2019. EXAM: OBSTETRIC <14 WK [place] AND TRANSVAGINAL OB US TECHNIQUE: Both transabdominal and transvaginal ultrasound examinations were performed for complete evaluation of the gestation as well as the maternal uterus, adnexal regions, and pelvic cul-de-sac. Transvaginal technique was performed to assess early pregnancy. COMPARISON:  None. FINDINGS: Intrauterine gestational sac: Small cystic area within the endometrial canal likely represents a gestational sac. This measures 2.5 x 2.5 x 3.8 cm. Yolk sac:  Probable small yolk sac. Embryo:  Not visualized. Cardiac Activity: Not visualized. MSD: 2.9 mm   4 w   6 d Subchorionic hemorrhage:  None visualized. Maternal uterus/adnexae: The right ovary is not visualized. Small left corpus luteal and simple left  ovarian cysts. Trace free pelvic fluid. IMPRESSION: 1. Probable early intrauterine gestational sac with a yolk sac but no fetal pole or cardiac activity yet visualized to confirm viability. Recommend follow-up quantitative B-HCG levels and follow-up US in 14 days to assess viability. This recommendation follows SRU consensus guidelines: Diagnostic Criteria for Nonviable Pregnancy Early in the First Trimester. Malva Limes Med 2013; 160:1093-23. 2. No apparent acute findings. Electronically Signed   By: Carey Bullocks M.D.   On: 06/27/2019 14:04    MDM Pelvic Exam Ultrasound Assessment and Plan  23 year old G1P0 at 6.0 Threatened Miscarriage  -POC discussed. -Exam performed and findings discussed. -Patient sent to [place] and findings remain consistent, but with new [facility]. -Patient informed of findings. -Reviewed necessity of follow up on Friday at [facility] for repeat lab work. -Patient without questions or concerns. -Bleeding Precautions Given. -  Encouraged to call or return to MAU if symptoms worsen or with the onset of new symptoms. -Discharged to home in stable condition.   Cherre RobinsJessica L Emmerich Cryer, MSN, CNM 06/28/2019, 3:15 AM

## 2019-06-28 NOTE — MAU Note (Signed)
Pt has had spotting since TUesday. Bleeding more yesterday and was seen in MAU. Got up to [place] this am and passed some clots and ? Sac. Some lower back pain

## 2019-06-28 NOTE — Progress Notes (Signed)
Gavin Pound CNM in earlier to discuss test result and d/c plan. Written and verbal d/c instructions given and understanding voiced. Will have BHCG drawn Fri at 0800 at [facility]

## 2019-06-29 ENCOUNTER — Other Ambulatory Visit: Payer: BC Managed Care – PPO

## 2019-06-29 ENCOUNTER — Telehealth: Payer: Self-pay

## 2019-06-29 ENCOUNTER — Other Ambulatory Visit: Payer: Self-pay

## 2019-06-29 DIAGNOSIS — O4690 Antepartum hemorrhage, unspecified, unspecified trimester: Secondary | ICD-10-CM

## 2019-06-29 LAB — BETA HCG QUANT (REF LAB): hCG Quant: 64 m[IU]/mL

## 2019-06-29 NOTE — Progress Notes (Signed)
error 

## 2019-06-29 NOTE — Telephone Encounter (Signed)
Spoke with patient regarding HCG results, HCG went down to 64 today. Consulted with Dr. Jodi Mourning regarding result and he advised that pt is miscarrying. He advised pt come back to office next Tuesday for another HCG lab draw. I spoke with patient and advised her of results and recommendations. Pt is very tearful and upset. I advised pt that if she has heavy bleeding filling up more than one pad per hour she should go to hospital for evaluation, but did advise her that some bleeding is to be expected. Advised pt that she will need to come have HCG lab draw next Tuesday and we will follow that number down to 0. Pt verbalizes understanding. Schedulers made aware.

## 2019-07-03 ENCOUNTER — Ambulatory Visit: Payer: BC Managed Care – PPO | Admitting: Nurse Practitioner

## 2019-07-03 ENCOUNTER — Ambulatory Visit (INDEPENDENT_AMBULATORY_CARE_PROVIDER_SITE_OTHER): Payer: BC Managed Care – PPO | Admitting: Nurse Practitioner

## 2019-07-03 ENCOUNTER — Encounter: Payer: Self-pay | Admitting: Nurse Practitioner

## 2019-07-03 ENCOUNTER — Other Ambulatory Visit: Payer: Self-pay

## 2019-07-03 VITALS — BP 129/90 | HR 98 | Ht 62.0 in | Wt 149.0 lb

## 2019-07-03 DIAGNOSIS — O039 Complete or unspecified spontaneous abortion without complication: Secondary | ICD-10-CM | POA: Diagnosis not present

## 2019-07-03 DIAGNOSIS — E1065 Type 1 diabetes mellitus with hyperglycemia: Secondary | ICD-10-CM

## 2019-07-03 NOTE — Progress Notes (Signed)
needed  GYNECOLOGY OFFICE VISIT NOTE   History:  23 y.o. G1P0010 here today for miscarriage.  Was seen in MAU twice for bleeding.  Also had high blood sugars in MAU.  She has type 1 DM for 11 years. She does not eat regular meals.  Eats once or twice a day.  Checks her blood sugars irregularly.  She has an endocrinologist in [place] that she saw last in January 2020.  They have been working to stabilize her medications and her blood sugars.. She denies any abnormal vaginal discharge, bleeding, pelvic pain or other concerns.   Past Medical History:  Diagnosis Date  . Anemia   . Anxiety   . Depression   . Diabetes mellitus without complication (HCC)   . Hypertension     History reviewed. No pertinent surgical history.  The following portions of the patient's history were reviewed and updated as appropriate: allergies, current medications, past family history, past medical history, past social history, past surgical history and problem list.   Health Maintenance:  pap smear needed but not done today due to recent miscarriage.    Review of Systems:  Pertinent items noted in HPI and remainder of comprehensive ROS otherwise negative.  Objective:  Physical Exam BP 129/90   Pulse 98   Ht 5\' 2"  (1.575 m)   Wt 149 lb (67.6 kg)   LMP 07/03/2019   BMI 27.25 kg/m  CONSTITUTIONAL: Well-developed, well-nourished female in no acute distress.  HENT:  Normocephalic, atraumatic. External right and left ear normal.  EYES: Conjunctivae and EOM are normal. Pupils are equal, round.  No scleral icterus.  NECK: Normal range of motion, supple, no masses SKIN: Skin is warm and dry. No rash noted. Not diaphoretic. No erythema. No pallor. NEUROLOGIC: Alert and oriented to person, place, and time. Normal muscle tone coordination. No cranial nerve deficit noted. PSYCHIATRIC: Normal mood and affect. Normal behavior. Normal judgment and thought content. MUSCULOSKELETAL: Normal range of motion. No edema  noted.  Labs and Imaging 07/05/2019 OB Transvaginal  Result Date: 06/28/2019 CLINICAL DATA:  Pregnant patient in first-trimester pregnancy with vaginal bleeding. EXAM: TRANSVAGINAL OB ULTRASOUND TECHNIQUE: Transvaginal ultrasound was performed for complete evaluation of the gestation as well as the maternal uterus, adnexal regions, and pelvic cul-de-sac. COMPARISON:  Obstetric ultrasound yesterday, proximally 13 hours prior. FINDINGS: Intrauterine gestational sac: Single Yolk sac:  Not definitively seen. Embryo:  Not Visualized. Cardiac Activity: Not Visualized. MSD: 2.4 mm   4 w   6 d Subchorionic hemorrhage:  Small measuring 8 x 6 x 4 mm. Maternal uterus/adnexae: Left ovary measures 3.8 x 2.2 x 3.6 cm and contains a corpus luteum. The right ovary appears normal. No adnexal mass. No pelvic free fluid. IMPRESSION: 1. Probable early intrauterine gestational sac, the faint yolk sac on exam yesterday is not definitively seen. No fetal pole or cardiac activity yet visualized. Recommend follow-up quantitative B-HCG levels and follow-up 14/04/2019 in 14 days to assess viability. This recommendation follows SRU consensus guidelines: Diagnostic Criteria for Nonviable Pregnancy Early in the First Trimester. [place] Med [id number]. 2. Small subchorionic hemorrhage has developed since exam yesterday. Electronically Signed   By: ; [id number] M.D.   On: 06/28/2019 03:15   14/04/2019 OB LESS THAN 14 WEEKS WITH OB TRANSVAGINAL  Result Date: 06/27/2019 CLINICAL DATA:  First trimester pregnancy with vaginal bleeding. Positive urine pregnancy test. Quantitative beta HCG level pending. LMP 05/17/2019. EXAM: OBSTETRIC <14 WK 05/19/2019 AND TRANSVAGINAL OB US TECHNIQUE: Both transabdominal and transvaginal  ultrasound examinations were performed for complete evaluation of the gestation as well as the maternal uterus, adnexal regions, and pelvic cul-de-sac. Transvaginal technique was performed to assess early pregnancy. COMPARISON:  None. FINDINGS:  Intrauterine gestational sac: Small cystic area within the endometrial canal likely represents a gestational sac. This measures 2.5 x 2.5 x 3.8 cm. Yolk sac:  Probable small yolk sac. Embryo:  Not visualized. Cardiac Activity: Not visualized. MSD: 2.9 mm   4 w   6 d Subchorionic hemorrhage:  None visualized. Maternal uterus/adnexae: The right ovary is not visualized. Small left corpus luteal and simple left ovarian cysts. Trace free pelvic fluid. IMPRESSION: 1. Probable early intrauterine gestational sac with a yolk sac but no fetal pole or cardiac activity yet visualized to confirm viability. Recommend follow-up quantitative B-HCG levels and follow-up US in 14 days to assess viability. This recommendation follows SRU consensus guidelines: Diagnostic Criteria for Nonviable Pregnancy Early in the First Trimester. [place] Med 2013; [id number]:0037-04. 2. No apparent acute findings. Electronically Signed   By: Richardean Sale M.D.   On: 06/27/2019 14:04    Assessment & Plan:  1. Type 1 diabetes mellitus with hyperglycemia ([place]) Will make appointment with her endocrinologist in [place], [place]. Last seen in January 2020. Living here now but wants to maintain relationship with her MD in [place]. Advised regular checking of blood sugars and keeping a written record  - Hemoglobin A1c  2. Miscarriage BHCG has fallen dramatically from 481 on 06-27-19 to 64 on 06-29-19   Routine preventative health maintenance measures emphasized. Please refer to After Visit Summary for other counseling recommendations.   Return in about 2 weeks (around 07/17/2019) for Provider visit - Pregnancy test and BP check. Provider also will need to check to make sure she has an appointment with her endocrinologist.     Total face-to-face time with patient: 20 minutes.  Over 50% of encounter was spent on counseling and coordination of care.  Earlie Server, RN, MSN, NP-BC Nurse Practitioner, [facility] for [employer] 07/03/2019 2:02 PM

## 2019-07-03 NOTE — Progress Notes (Signed)
Pt presents for f/u after SAB 06/29/2019. EPDS = 11

## 2019-07-04 LAB — HEMOGLOBIN A1C
Est. average glucose Bld gHb Est-mCnc: 338 mg/dL
Hgb A1c MFr Bld: 13.4 % — ABNORMAL HIGH (ref 4.8–5.6)

## 2019-07-19 ENCOUNTER — Ambulatory Visit: Payer: BC Managed Care – PPO | Admitting: Obstetrics and Gynecology

## 2019-07-30 ENCOUNTER — Ambulatory Visit (INDEPENDENT_AMBULATORY_CARE_PROVIDER_SITE_OTHER): Payer: BC Managed Care – PPO | Admitting: Obstetrics and Gynecology

## 2019-07-30 ENCOUNTER — Encounter: Payer: Self-pay | Admitting: Obstetrics and Gynecology

## 2019-07-30 ENCOUNTER — Other Ambulatory Visit: Payer: Self-pay

## 2019-07-30 VITALS — BP 131/87 | HR 103 | Wt 147.0 lb

## 2019-07-30 DIAGNOSIS — O039 Complete or unspecified spontaneous abortion without complication: Secondary | ICD-10-CM | POA: Diagnosis not present

## 2019-07-30 DIAGNOSIS — Z5189 Encounter for other specified aftercare: Secondary | ICD-10-CM

## 2019-07-30 DIAGNOSIS — B379 Candidiasis, unspecified: Secondary | ICD-10-CM

## 2019-07-30 MED ORDER — FLUCONAZOLE 150 MG PO TABS: 150.0000 mg | ORAL_TABLET | Freq: Once | ORAL | 0 refills | Status: AC

## 2019-07-30 NOTE — Progress Notes (Signed)
24 yo G1P0010 diagnosed with a spontaneous miscarriage in mid-December 2020 here for follow up. Patient with poorly controlled diabetes with plans to follow up with her endocrinologist in [place]. Patient reports feeling well. She has not had a period yet.  Past Medical History:  Diagnosis Date  . Anemia   . Anxiety   . Depression   . Diabetes mellitus without complication (HCC)   . Hypertension    No past surgical history on file. Family History  Problem Relation Age of Onset  . Hypertension Mother   . Cancer Maternal Grandfather    Social History   Tobacco Use  . Smoking status: Never Smoker  . Smokeless tobacco: Never Used  Substance Use Topics  . Alcohol use: Never  . Drug use: Never   ROS See pertinent in HPI. All other systems reviewed and negative  Blood pressure 131/87, pulse (!) 103, weight 147 lb (66.7 kg), last menstrual period 07/03/2019.  GENERAL: Well-developed, well-nourished female in no acute distress.  ABDOMEN: Soft, nontender, nondistended. No organomegaly. NEURO: alert and oriented x 3  A/P 24 yo s/p miscarriage - quat HCG today - Patient requesting diflucan for yeast infection - Patient desires to conceive and declines contraception at this time. Discussed timing of next pregnancy when diabetes is better controlled to avoid pregnancy complication. Patient verbalized understanding and all questions were answered - Patient will be contacted with results

## 2019-07-30 NOTE — Addendum Note (Signed)
Addended by: Catalina Antigua on: 07/30/2019 04:35 PM   Modules accepted: Orders

## 2019-07-31 LAB — BETA HCG QUANT (REF LAB): hCG Quant: <1 m[IU]/mL

## 2019-10-04 DIAGNOSIS — H35371 Puckering of macula, right eye: Secondary | ICD-10-CM | POA: Diagnosis not present

## 2019-10-04 DIAGNOSIS — E103211 Type 1 diabetes mellitus with mild nonproliferative diabetic retinopathy with macular edema, right eye: Secondary | ICD-10-CM | POA: Diagnosis not present

## 2019-12-16 DIAGNOSIS — H5210 Myopia, unspecified eye: Secondary | ICD-10-CM | POA: Diagnosis not present

## 2020-01-15 IMAGING — US US OB TRANSVAGINAL
1 series · 15 of 25 positions shown · non-contrast
Comparison: Obstetric ultrasound yesterday, proximally 13 hours
prior.

CLINICAL DATA: Pregnant patient in first-trimester pregnancy with
vaginal bleeding.

EXAM:
TRANSVAGINAL OB ULTRASOUND
TECHNIQUE: Transvaginal ultrasound was performed for complete evaluation of the
gestation as well as the maternal uterus, adnexal regions, and
pelvic cul-de-sac.

[Series 1: us ob transvaginal · 15 of 25 slices shown]
[im 1/25]
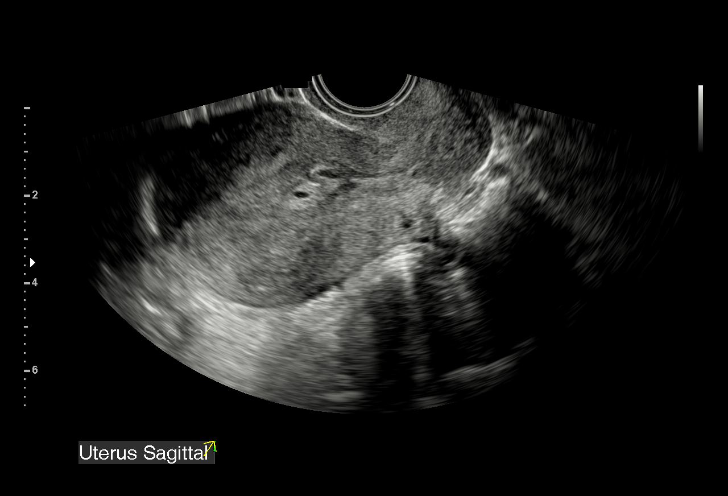
[im 3/25]
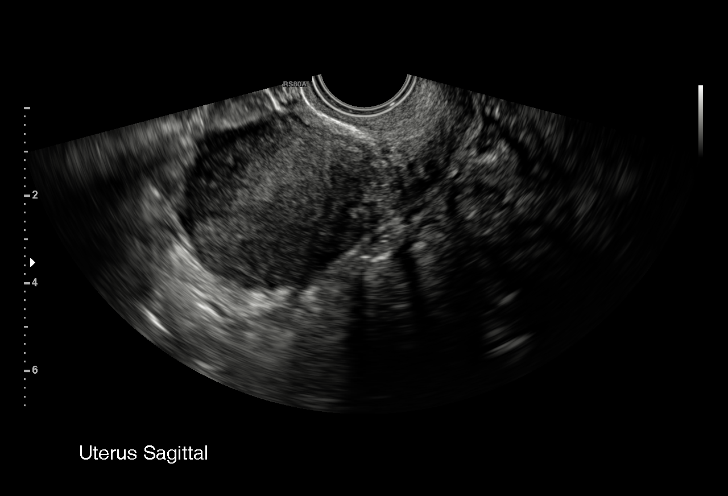
[im 5/25]
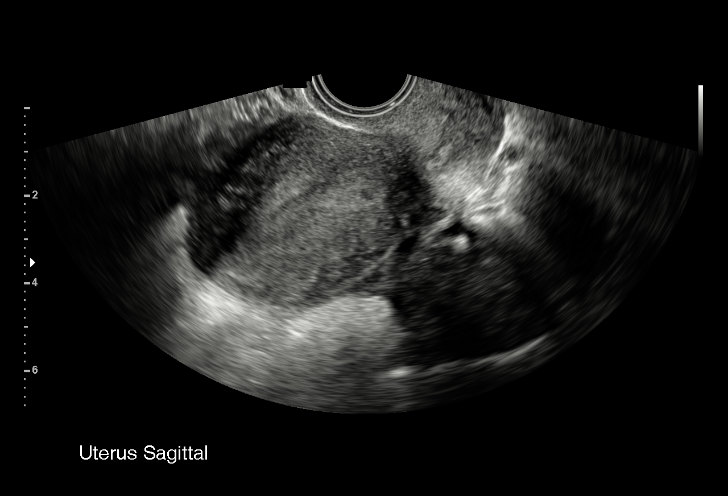
[im 6/25]
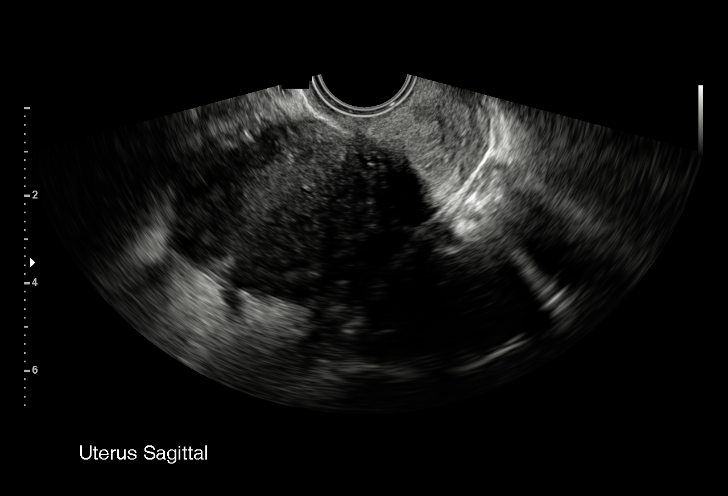
[im 8/25]
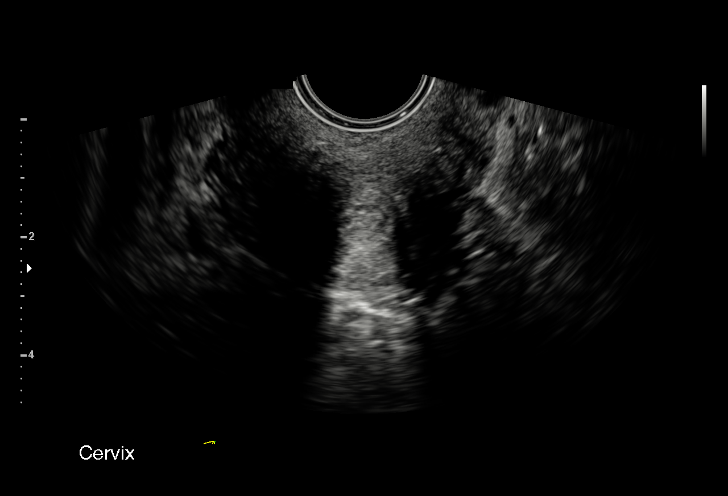
[im 10/25]
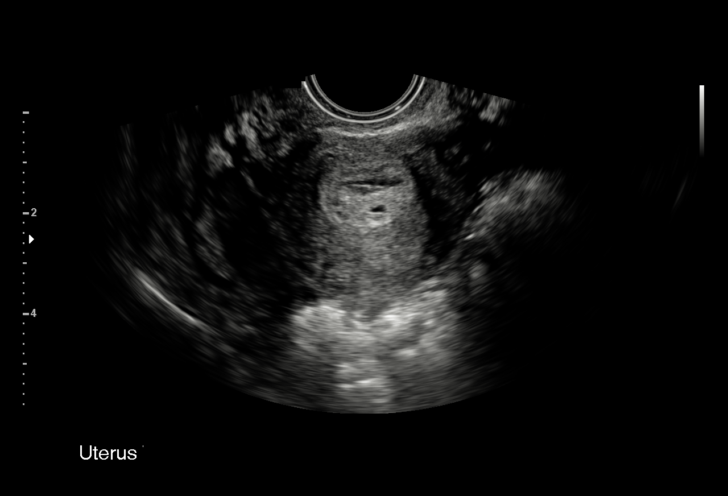
[im 11/25]
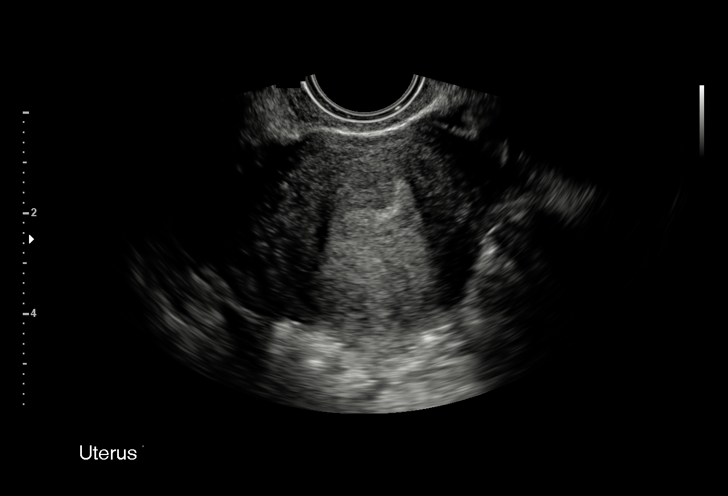
[im 13/25]
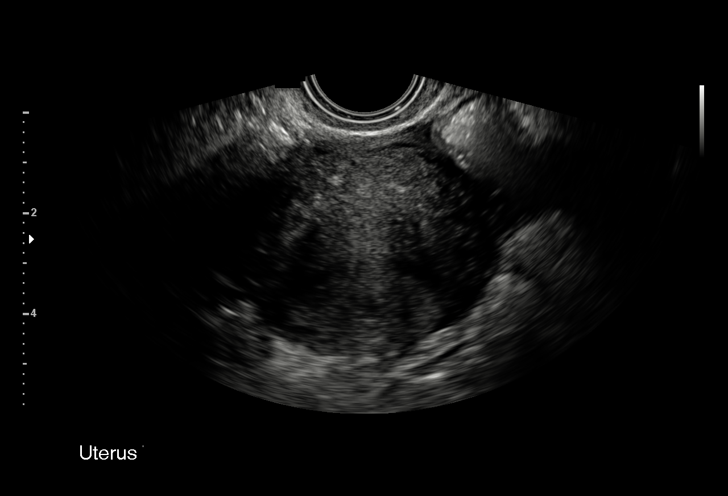
[im 15/25]
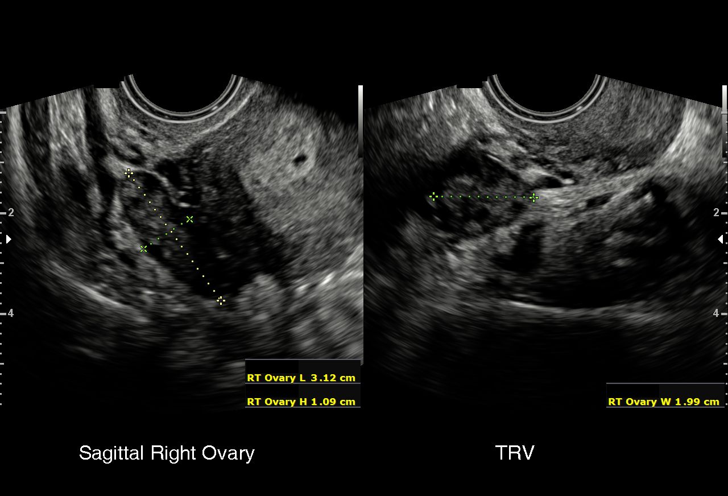
[im 16/25]
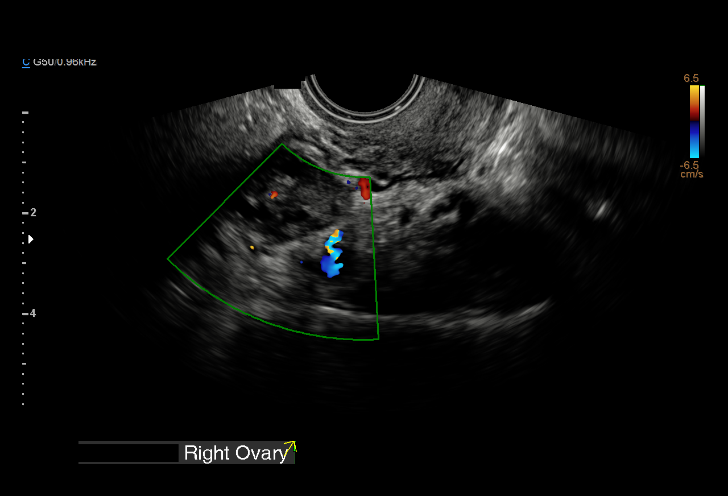
[im 18/25]
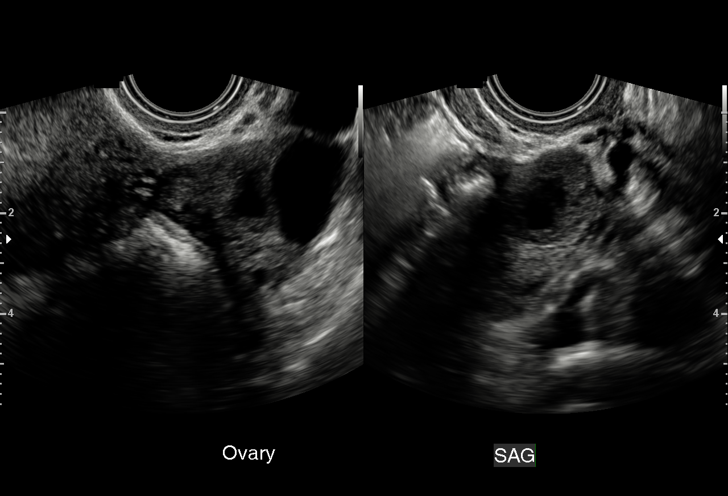
[im 20/25]
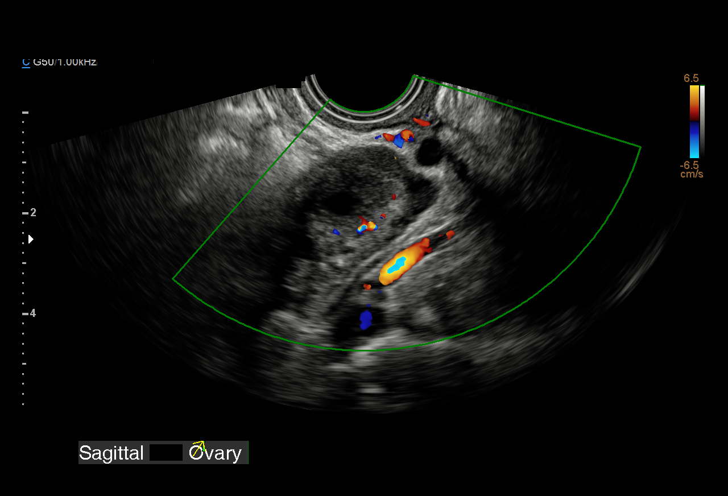
[im 21/25]
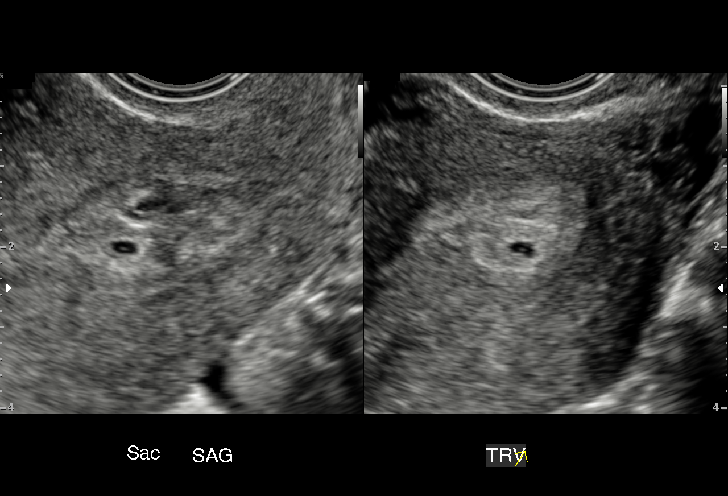
[im 23/25]
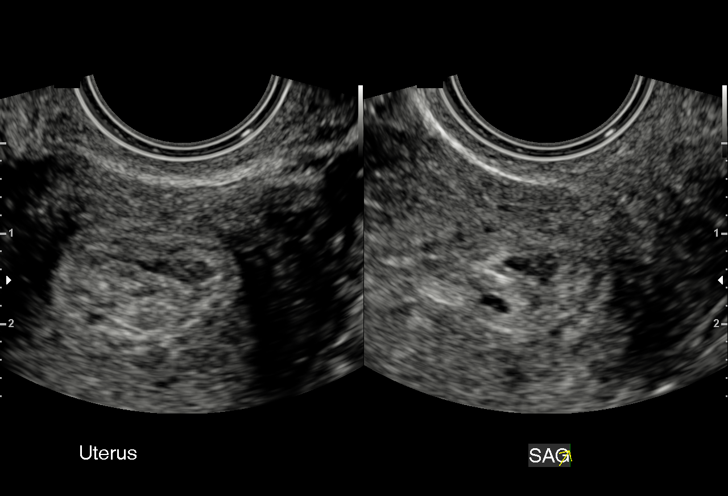
[im 25/25]
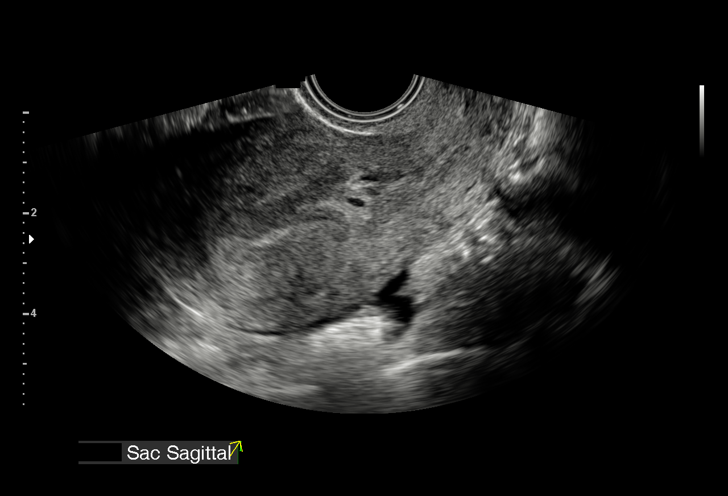

[15 of 25 positions shown; findings below may reference images not displayed]

FINDINGS: Intrauterine gestational sac: Single

Yolk sac:  Not definitively seen.

Embryo:  Not Visualized.

Cardiac Activity: Not Visualized.

MSD: 2.4 mm   4 w   6 d

Subchorionic hemorrhage:  Small measuring 8 x 6 x 4 mm.

Maternal uterus/adnexae: Left ovary measures 3.8 x 2.2 x 3.6 cm and
contains a corpus luteum. The right ovary appears normal. No adnexal
mass. No pelvic free fluid.
IMPRESSION: 1. Probable early intrauterine gestational sac, the faint yolk sac
on exam yesterday is not definitively seen. No fetal pole or cardiac
activity yet visualized. Recommend follow-up quantitative B-HCG
levels and follow-up US in 14 days to assess viability. This
recommendation follows SRU consensus guidelines: Diagnostic Criteria
for Nonviable Pregnancy Early in the First Trimester. N Engl J Med
2. Small subchorionic hemorrhage has developed since exam yesterday.

## 2020-01-31 DIAGNOSIS — N3000 Acute cystitis without hematuria: Secondary | ICD-10-CM | POA: Diagnosis not present

## 2020-01-31 DIAGNOSIS — M62830 Muscle spasm of back: Secondary | ICD-10-CM | POA: Diagnosis not present

## 2020-02-03 ENCOUNTER — Inpatient Hospital Stay (HOSPITAL_COMMUNITY)
Admission: EM | Admit: 2020-02-03 | Discharge: 2020-02-06 | DRG: 637 | Disposition: A | Discharge status: Home / Self Care | Payer: BC Managed Care – PPO | Attending: Family Medicine | Admitting: Family Medicine

## 2020-02-03 ENCOUNTER — Encounter (HOSPITAL_COMMUNITY): Payer: Self-pay | Admitting: Internal Medicine

## 2020-02-03 ENCOUNTER — Inpatient Hospital Stay (HOSPITAL_COMMUNITY): Payer: BC Managed Care – PPO

## 2020-02-03 ENCOUNTER — Other Ambulatory Visit: Payer: Self-pay

## 2020-02-03 DIAGNOSIS — E860 Dehydration: Secondary | ICD-10-CM | POA: Diagnosis present

## 2020-02-03 DIAGNOSIS — E872 Acidosis, unspecified: Secondary | ICD-10-CM

## 2020-02-03 DIAGNOSIS — F329 Major depressive disorder, single episode, unspecified: Secondary | ICD-10-CM | POA: Diagnosis not present

## 2020-02-03 DIAGNOSIS — Z6823 Body mass index (BMI) 23.0-23.9, adult: Secondary | ICD-10-CM | POA: Diagnosis not present

## 2020-02-03 DIAGNOSIS — Z8249 Family history of ischemic heart disease and other diseases of the circulatory system: Secondary | ICD-10-CM

## 2020-02-03 DIAGNOSIS — Z794 Long term (current) use of insulin: Secondary | ICD-10-CM

## 2020-02-03 DIAGNOSIS — R1110 Vomiting, unspecified: Secondary | ICD-10-CM | POA: Diagnosis not present

## 2020-02-03 DIAGNOSIS — E870 Hyperosmolality and hypernatremia: Secondary | ICD-10-CM | POA: Diagnosis not present

## 2020-02-03 DIAGNOSIS — F419 Anxiety disorder, unspecified: Secondary | ICD-10-CM | POA: Diagnosis not present

## 2020-02-03 DIAGNOSIS — Z9119 Patient's noncompliance with other medical treatment and regimen: Secondary | ICD-10-CM | POA: Diagnosis not present

## 2020-02-03 DIAGNOSIS — E1010 Type 1 diabetes mellitus with ketoacidosis without coma: Principal | ICD-10-CM | POA: Diagnosis present

## 2020-02-03 DIAGNOSIS — K5900 Constipation, unspecified: Secondary | ICD-10-CM | POA: Diagnosis not present

## 2020-02-03 DIAGNOSIS — Z79899 Other long term (current) drug therapy: Secondary | ICD-10-CM | POA: Diagnosis not present

## 2020-02-03 DIAGNOSIS — E1110 Type 2 diabetes mellitus with ketoacidosis without coma: Secondary | ICD-10-CM | POA: Diagnosis present

## 2020-02-03 DIAGNOSIS — I10 Essential (primary) hypertension: Secondary | ICD-10-CM | POA: Diagnosis present

## 2020-02-03 DIAGNOSIS — E1165 Type 2 diabetes mellitus with hyperglycemia: Secondary | ICD-10-CM | POA: Diagnosis not present

## 2020-02-03 DIAGNOSIS — N179 Acute kidney failure, unspecified: Secondary | ICD-10-CM | POA: Diagnosis present

## 2020-02-03 DIAGNOSIS — U071 COVID-19: Secondary | ICD-10-CM | POA: Diagnosis not present

## 2020-02-03 DIAGNOSIS — E876 Hypokalemia: Secondary | ICD-10-CM | POA: Diagnosis not present

## 2020-02-03 DIAGNOSIS — N390 Urinary tract infection, site not specified: Secondary | ICD-10-CM | POA: Diagnosis not present

## 2020-02-03 DIAGNOSIS — E875 Hyperkalemia: Secondary | ICD-10-CM | POA: Diagnosis not present

## 2020-02-03 DIAGNOSIS — E109 Type 1 diabetes mellitus without complications: Secondary | ICD-10-CM | POA: Diagnosis present

## 2020-02-03 DIAGNOSIS — R000 Tachycardia, unspecified: Secondary | ICD-10-CM

## 2020-02-03 DIAGNOSIS — R112 Nausea with vomiting, unspecified: Secondary | ICD-10-CM | POA: Diagnosis not present

## 2020-02-03 DIAGNOSIS — R636 Underweight: Secondary | ICD-10-CM | POA: Diagnosis present

## 2020-02-03 DIAGNOSIS — R0902 Hypoxemia: Secondary | ICD-10-CM | POA: Diagnosis not present

## 2020-02-03 DIAGNOSIS — E1065 Type 1 diabetes mellitus with hyperglycemia: Secondary | ICD-10-CM | POA: Diagnosis not present

## 2020-02-03 LAB — CBC
HCT: 49.8 % — ABNORMAL HIGH (ref 36.0–46.0)
HCT: 44 % (ref 36.0–46.0)
Hemoglobin: 14.8 g/dL (ref 12.0–15.0)
Hemoglobin: 13 g/dL (ref 12.0–15.0)
MCH: 25.4 pg — ABNORMAL LOW (ref 26.0–34.0)
MCH: 25.2 pg — ABNORMAL LOW (ref 26.0–34.0)
MCHC: 29.7 g/dL — ABNORMAL LOW (ref 30.0–36.0)
MCHC: 29.5 g/dL — ABNORMAL LOW (ref 30.0–36.0)
MCV: 85.6 fL (ref 80.0–100.0)
MCV: 85.3 fL (ref 80.0–100.0)
Platelets: 322 10*3/uL (ref 150–400)
Platelets: 274 10*3/uL (ref 150–400)
RBC: 5.82 MIL/uL — ABNORMAL HIGH (ref 3.87–5.11)
RBC: 5.16 MIL/uL — ABNORMAL HIGH (ref 3.87–5.11)
RDW: 12.7 % (ref 11.5–15.5)
RDW: 12.7 % (ref 11.5–15.5)
WBC: 6.3 10*3/uL (ref 4.0–10.5)
WBC: 7.6 10*3/uL (ref 4.0–10.5)
nRBC: 0 % (ref 0.0–0.2)
nRBC: 0 % (ref 0.0–0.2)

## 2020-02-03 LAB — URINALYSIS, ROUTINE W REFLEX MICROSCOPIC
Bacteria, UA: NONE SEEN
Bilirubin Urine: NEGATIVE
Glucose, UA: >=500 mg/dL — AB
Ketones, ur: 80 mg/dL — AB
Leukocytes,Ua: NEGATIVE
Nitrite: NEGATIVE
Protein, ur: 30 mg/dL — AB
Specific Gravity, Urine: 1.024 (ref 1.005–1.030)
pH: 5 (ref 5.0–8.0)

## 2020-02-03 LAB — BASIC METABOLIC PANEL
Anion gap: 30 — ABNORMAL HIGH (ref 5–15)
Anion gap: 25 — ABNORMAL HIGH (ref 5–15)
Anion gap: 20 — ABNORMAL HIGH (ref 5–15)
Anion gap: 21 — ABNORMAL HIGH (ref 5–15)
BUN: 14 mg/dL (ref 6–20)
BUN: 11 mg/dL (ref 6–20)
BUN: 11 mg/dL (ref 6–20)
BUN: 10 mg/dL (ref 6–20)
CO2: 10 mmol/L — ABNORMAL LOW (ref 22–32)
CO2: 10 mmol/L — ABNORMAL LOW (ref 22–32)
CO2: 15 mmol/L — ABNORMAL LOW (ref 22–32)
CO2: 11 mmol/L — ABNORMAL LOW (ref 22–32)
Calcium: 9.6 mg/dL (ref 8.9–10.3)
Calcium: 8.6 mg/dL — ABNORMAL LOW (ref 8.9–10.3)
Calcium: 8.9 mg/dL (ref 8.9–10.3)
Calcium: 9 mg/dL (ref 8.9–10.3)
Chloride: 94 mmol/L — ABNORMAL LOW (ref 98–111)
Chloride: 106 mmol/L (ref 98–111)
Chloride: 107 mmol/L (ref 98–111)
Chloride: 108 mmol/L (ref 98–111)
Creatinine, Ser: 1.19 mg/dL — ABNORMAL HIGH (ref 0.44–1.00)
Creatinine, Ser: 0.85 mg/dL (ref 0.44–1.00)
Creatinine, Ser: 0.85 mg/dL (ref 0.44–1.00)
Creatinine, Ser: 0.71 mg/dL (ref 0.44–1.00)
GFR calc Af Amer: >60 mL/min (ref >60)
GFR calc Af Amer: >60 mL/min (ref >60)
GFR calc Af Amer: >60 mL/min (ref >60)
GFR calc Af Amer: >60 mL/min (ref >60)
GFR calc non Af Amer: >60 mL/min (ref >60)
GFR calc non Af Amer: >60 mL/min (ref >60)
GFR calc non Af Amer: >60 mL/min (ref >60)
GFR calc non Af Amer: >60 mL/min (ref >60)
Glucose, Bld: 622 mg/dL (ref 70–99)
Glucose, Bld: 283 mg/dL — ABNORMAL HIGH (ref 70–99)
Glucose, Bld: 196 mg/dL — ABNORMAL HIGH (ref 70–99)
Glucose, Bld: 190 mg/dL — ABNORMAL HIGH (ref 70–99)
Potassium: 5.1 mmol/L (ref 3.5–5.1)
Potassium: 5.4 mmol/L — ABNORMAL HIGH (ref 3.5–5.1)
Potassium: 4 mmol/L (ref 3.5–5.1)
Potassium: 4 mmol/L (ref 3.5–5.1)
Sodium: 134 mmol/L — ABNORMAL LOW (ref 135–145)
Sodium: 141 mmol/L (ref 135–145)
Sodium: 142 mmol/L (ref 135–145)
Sodium: 140 mmol/L (ref 135–145)

## 2020-02-03 LAB — ABO/RH: ABO/RH(D): O POS

## 2020-02-03 LAB — GLUCOSE, CAPILLARY
Glucose-Capillary: 202 mg/dL — ABNORMAL HIGH (ref 70–99)
Glucose-Capillary: 208 mg/dL — ABNORMAL HIGH (ref 70–99)
Glucose-Capillary: 212 mg/dL — ABNORMAL HIGH (ref 70–99)
Glucose-Capillary: 220 mg/dL — ABNORMAL HIGH (ref 70–99)
Glucose-Capillary: 232 mg/dL — ABNORMAL HIGH (ref 70–99)
Glucose-Capillary: 232 mg/dL — ABNORMAL HIGH (ref 70–99)
Glucose-Capillary: 250 mg/dL — ABNORMAL HIGH (ref 70–99)
Glucose-Capillary: 251 mg/dL — ABNORMAL HIGH (ref 70–99)
Glucose-Capillary: 269 mg/dL — ABNORMAL HIGH (ref 70–99)

## 2020-02-03 LAB — BLOOD GAS, VENOUS
Acid-base deficit: 20.1 mmol/L — ABNORMAL HIGH (ref 0.0–2.0)
Bicarbonate: 9 mmol/L — ABNORMAL LOW (ref 20.0–28.0)
O2 Saturation: 65.5 %
Patient temperature: 98.6
pCO2, Ven: 29.7 mmHg — ABNORMAL LOW (ref 44.0–60.0)
pH, Ven: 7.109 — CL (ref 7.250–7.430)
pO2, Ven: 47.9 mmHg — ABNORMAL HIGH (ref 32.0–45.0)

## 2020-02-03 LAB — CBG MONITORING, ED
Glucose-Capillary: >600 mg/dL (ref 70–99)
Glucose-Capillary: 559 mg/dL (ref 70–99)
Glucose-Capillary: 529 mg/dL (ref 70–99)
Glucose-Capillary: 499 mg/dL — ABNORMAL HIGH (ref 70–99)
Glucose-Capillary: 326 mg/dL — ABNORMAL HIGH (ref 70–99)
Glucose-Capillary: 242 mg/dL — ABNORMAL HIGH (ref 70–99)

## 2020-02-03 LAB — BETA-HYDROXYBUTYRIC ACID
Beta-Hydroxybutyric Acid: >8 mmol/L — ABNORMAL HIGH (ref 0.05–0.27)
Beta-Hydroxybutyric Acid: 6.29 mmol/L — ABNORMAL HIGH (ref 0.05–0.27)
Beta-Hydroxybutyric Acid: 3.12 mmol/L — ABNORMAL HIGH (ref 0.05–0.27)

## 2020-02-03 LAB — HIV ANTIBODY (ROUTINE TESTING W REFLEX): HIV Screen 4th Generation wRfx: NONREACTIVE

## 2020-02-03 LAB — I-STAT BETA HCG BLOOD, ED (MC, WL, AP ONLY): I-stat hCG, quantitative: <5 m[IU]/mL (ref <5)

## 2020-02-03 LAB — C-REACTIVE PROTEIN: CRP: 0.6 mg/dL (ref <1.0)

## 2020-02-03 LAB — D-DIMER, QUANTITATIVE: D-Dimer, Quant: 0.51 ug/mL-FEU — ABNORMAL HIGH (ref 0.00–0.50)

## 2020-02-03 LAB — PROCALCITONIN: Procalcitonin: <0.1 ng/mL

## 2020-02-03 LAB — MRSA PCR SCREENING: MRSA by PCR: NEGATIVE

## 2020-02-03 LAB — FERRITIN: Ferritin: 142 ng/mL (ref 11–307)

## 2020-02-03 LAB — SARS CORONAVIRUS 2 BY RT PCR (HOSPITAL ORDER, PERFORMED IN ~~LOC~~ HOSPITAL LAB): SARS Coronavirus 2: POSITIVE — AB

## 2020-02-03 MED ORDER — ONDANSETRON HCL 4 MG/2ML IJ SOLN
4.0000 mg | Freq: Four times a day (QID) | INTRAMUSCULAR | Status: DC | PRN
  Administered 2020-02-03 – 2020-02-05 (×5): 4 mg via INTRAVENOUS
  Filled 2020-02-03 (×5): qty 2

## 2020-02-03 MED ORDER — TRAMADOL HCL 50 MG PO TABS
50.0000 mg | ORAL_TABLET | Freq: Once | ORAL | Status: AC
  Administered 2020-02-03: 50 mg via ORAL
  Filled 2020-02-03: qty 1

## 2020-02-03 MED ORDER — PNEUMOCOCCAL VAC POLYVALENT 25 MCG/0.5ML IJ INJ
0.5000 mL | INJECTION | INTRAMUSCULAR | Status: DC
  Filled 2020-02-03: qty 0.5

## 2020-02-03 MED ORDER — INSULIN ASPART 100 UNIT/ML ~~LOC~~ SOLN
10.0000 [IU] | Freq: Once | SUBCUTANEOUS | Status: AC
  Administered 2020-02-03: 10 [IU] via SUBCUTANEOUS
  Filled 2020-02-03: qty 0.1

## 2020-02-03 MED ORDER — CHLORHEXIDINE GLUCONATE 0.12 % MT SOLN
15.0000 mL | Freq: Two times a day (BID) | OROMUCOSAL | Status: DC
  Administered 2020-02-03 – 2020-02-06 (×6): 15 mL via OROMUCOSAL
  Filled 2020-02-03 (×5): qty 15

## 2020-02-03 MED ORDER — ONDANSETRON HCL 4 MG/2ML IJ SOLN
4.0000 mg | Freq: Once | INTRAMUSCULAR | Status: AC
  Administered 2020-02-03: 4 mg via INTRAVENOUS
  Filled 2020-02-03: qty 2

## 2020-02-03 MED ORDER — ORAL CARE MOUTH RINSE
15.0000 mL | Freq: Two times a day (BID) | OROMUCOSAL | Status: DC
  Administered 2020-02-04 – 2020-02-06 (×3): 15 mL via OROMUCOSAL

## 2020-02-03 MED ORDER — ZINC SULFATE 220 (50 ZN) MG PO CAPS
220.0000 mg | ORAL_CAPSULE | Freq: Every day | ORAL | Status: DC
  Administered 2020-02-04 – 2020-02-06 (×3): 220 mg via ORAL
  Filled 2020-02-03 (×4): qty 1

## 2020-02-03 MED ORDER — ACETAMINOPHEN 650 MG RE SUPP: 650.0000 mg | Freq: Four times a day (QID) | RECTAL | Status: DC | PRN

## 2020-02-03 MED ORDER — ASCORBIC ACID 500 MG PO TABS
500.0000 mg | ORAL_TABLET | Freq: Every day | ORAL | Status: DC
  Administered 2020-02-04 – 2020-02-06 (×3): 500 mg via ORAL
  Filled 2020-02-03 (×4): qty 1

## 2020-02-03 MED ORDER — DEXTROSE 50 % IV SOLN: 0.0000 mL | INTRAVENOUS | Status: DC | PRN

## 2020-02-03 MED ORDER — CHLORHEXIDINE GLUCONATE CLOTH 2 % EX PADS
6.0000 | MEDICATED_PAD | Freq: Every day | CUTANEOUS | Status: DC
  Administered 2020-02-03 – 2020-02-06 (×3): 6 via TOPICAL

## 2020-02-03 MED ORDER — LACTATED RINGERS IV BOLUS
1000.0000 mL | INTRAVENOUS | Status: AC
  Administered 2020-02-03 (×2): 1000 mL via INTRAVENOUS

## 2020-02-03 MED ORDER — SODIUM CHLORIDE 0.9 % IV BOLUS (SEPSIS)
1000.0000 mL | Freq: Once | INTRAVENOUS | Status: AC
  Administered 2020-02-03: 1000 mL via INTRAVENOUS

## 2020-02-03 MED ORDER — DEXTROSE-NACL 5-0.45 % IV SOLN: INTRAVENOUS | Status: DC

## 2020-02-03 MED ORDER — INSULIN REGULAR(HUMAN) IN NACL 100-0.9 UT/100ML-% IV SOLN
INTRAVENOUS | Status: DC
  Administered 2020-02-03: 8 [IU]/h via INTRAVENOUS
  Administered 2020-02-04: 5 [IU]/h via INTRAVENOUS
  Filled 2020-02-03 (×2): qty 100

## 2020-02-03 MED ORDER — ONDANSETRON HCL 4 MG/2ML IJ SOLN
INTRAMUSCULAR | Status: AC
  Administered 2020-02-04: 4 mg via INTRAVENOUS
  Filled 2020-02-03: qty 2

## 2020-02-03 MED ORDER — ACETAMINOPHEN 325 MG PO TABS
650.0000 mg | ORAL_TABLET | Freq: Four times a day (QID) | ORAL | Status: DC | PRN
  Administered 2020-02-04: 650 mg via ORAL
  Filled 2020-02-03: qty 2

## 2020-02-03 MED ORDER — SODIUM CHLORIDE 0.9 % IV SOLN: INTRAVENOUS | Status: DC

## 2020-02-03 MED ORDER — ENOXAPARIN SODIUM 40 MG/0.4ML ~~LOC~~ SOLN
40.0000 mg | SUBCUTANEOUS | Status: DC
  Administered 2020-02-03: 40 mg via SUBCUTANEOUS
  Filled 2020-02-03 (×2): qty 0.4

## 2020-02-03 NOTE — H&P (Addendum)
History and Physical    Zoe Fox GEX:[id number] DOB: 1995/12/10 DOA: 02/03/2020  PCP: Patient, No Pcp Per  Patient coming from: Home  Chief Complaint: Hyperglycemia  HPI: Zoe Fox is a 24 y.o. female with medical history significant of DM1, depression/anxiety who presents to ED with hyperglycemia. Pt was recently seen in urgent care for complaints of back/flank pains 2 days prior to visit and was diagnosed with UTI, given mactrobid. Results of UA not available on this system, however per EDP who has access to those results, no nitrates or leuks were noted on that study. Denies dysuria. Since starting abx, patient reports intractable n/v, unable to tolerate drink or trial of chicken noodle soup. Pt reports compliance with insulin.  Received first covid vaccine injection one week prior to visit  ED Course: In the ed, pt noted to have glucose 559 with anion gap of 30. Serum bicarb 10 wit pH of 7.1. Patient was given IVF bolus and insulin gtt started. Hospitalist consulted for consideration for admission.  Review of Systems:  Review of Systems  Constitutional: Negative for chills, fever and malaise/fatigue.  HENT: Negative for ear discharge and nosebleeds.   Eyes: Negative for double vision, photophobia and pain.  Respiratory: Negative for hemoptysis, sputum production and shortness of breath.   Cardiovascular: Negative for chest pain and orthopnea.  Gastrointestinal: Positive for nausea and vomiting. Negative for diarrhea.  Genitourinary: Negative for frequency, hematuria and urgency.  Musculoskeletal: Positive for back pain. Negative for falls and neck pain.  Neurological: Negative for sensory change, speech change, seizures and loss of consciousness.  Psychiatric/Behavioral: Negative for hallucinations and memory loss. The patient is not nervous/anxious.     Past Medical History:  Diagnosis Date  . Anemia   . Anxiety   . Depression   . Diabetes mellitus without complication  (HCC)   . Hypertension     No past surgical history on file.   reports that she has never smoked. She has never used smokeless tobacco. She reports that she does not drink alcohol and does not use drugs.  No Known Allergies  Family History  Problem Relation Age of Onset  . Hypertension Mother   . Cancer Maternal Grandfather     Prior to Admission medications   Medication Sig Start Date End Date Taking? Authorizing Provider  cyclobenzaprine (FLEXERIL) 5 MG tablet Take 5 mg by mouth at bedtime. 01/31/20   [provider]  enalapril (VASOTEC) 10 MG tablet Take by mouth. 12/24/15   [provider]  fluticasone (FLONASE) 50 MCG/ACT nasal spray Place 2 sprays into both nostrils daily for 10 days. 08/17/18 08/27/18  Benay Pike, NP  insulin aspart (NOVOLOG) 100 UNIT/ML injection Inject into the skin 3 (three) times daily before meals.    [provider]  insulin glargine (LANTUS) 100 UNIT/ML injection Inject into the skin daily.    [provider]  nitrofurantoin, macrocrystal-monohydrate, (MACROBID) 100 MG capsule Take 100 mg by mouth at bedtime. 10 day supply 01/31/20   [provider]    Physical Exam: Vitals:   02/03/20 0803 02/03/20 0811 02/03/20 0849 02/03/20 0932  BP:  (!) 132/107 (!) 142/100 (!) 130/92  Pulse:  (!) 138 (!) 127 (!) 119  Resp:  20 (!) 22 (!) 21  Temp:  98.1 F (36.7 C)    TempSrc:  Oral    SpO2:  99% 99% 98%  Weight: 66.7 kg     Height: 5\' 6"  (1.676 m)  Constitutional: NAD, calm, comfortable  Vitals:   02/03/20 0803 02/03/20 0811 02/03/20 0849 02/03/20 0932  BP:  (!) 132/107 (!) 142/100 (!) 130/92  Pulse:  (!) 138 (!) 127 (!) 119  Resp:  20 (!) 22 (!) 21  Temp:  98.1 F (36.7 C)    TempSrc:  Oral    SpO2:  99% 99% 98%  Weight: 66.7 kg     Height: 5\' 6"  (1.676 m)      Eyes: PERRL, lids and conjunctivae normal ENMT: Mucous membranes are dry. Posterior pharynx clear of any exudate or  lesions.Normal dentition.  Neck: normal, supple, no masses, no thyromegaly Respiratory: clear to auscultation bilaterally, normal resp effort Cardiovascular: Regular rate and rhythm,s1, s2 No carotid bruits.  Abdomen: no tenderness, no masses palpated. Bowel sounds positive.  Musculoskeletal: no clubbing / cyanosis. No joint deformity upper and lower extremities. Good ROM, no contractures. Normal muscle tone.  Skin: no rashes, lesions, No induration Neurologic: CN 2-12 grossly intact. Sensation intact,. Strength 5/5 in all 4.  Psychiatric: Normal judgment and insight. Alert and oriented x 3. Normal mood.    Labs on Admission: I have personally reviewed following labs and imaging studies  CBC: Recent Labs  Lab 02/03/20 0812  WBC 6.3  HGB 14.8  HCT 49.8*  MCV 85.6  PLT 322   Basic Metabolic Panel: Recent Labs  Lab 02/03/20 0812  NA 134*  K 5.1  CL 94*  CO2 10*  GLUCOSE 622*  BUN 14  CREATININE 1.19*  CALCIUM 9.6   GFR: Estimated Creatinine Clearance: 69.4 mL/min (A) (by C-G formula based on SCr of 1.19 mg/dL (H)). Liver Function Tests: No results for input(s): AST, ALT, ALKPHOS, BILITOT, PROT, ALBUMIN in the last 168 hours. No results for input(s): LIPASE, AMYLASE in the last 168 hours. No results for input(s): AMMONIA in the last 168 hours. Coagulation Profile: No results for input(s): INR, PROTIME in the last 168 hours. Cardiac Enzymes: No results for input(s): CKTOTAL, CKMB, CKMBINDEX, TROPONINI in the last 168 hours. BNP (last 3 results) No results for input(s): PROBNP in the last 8760 hours. HbA1C: No results for input(s): HGBA1C in the last 72 hours. CBG: Recent Labs  Lab 02/03/20 0752 02/03/20 0919  GLUCAP >600* 559*   Lipid Profile: No results for input(s): CHOL, HDL, LDLCALC, TRIG, CHOLHDL, LDLDIRECT in the last 72 hours. Thyroid Function Tests: No results for input(s): TSH, T4TOTAL, FREET4, T3FREE, THYROIDAB in the last 72 hours. Anemia Panel: No  results for input(s): VITAMINB12, FOLATE, FERRITIN, TIBC, IRON, RETICCTPCT in the last 72 hours. Urine analysis:    Component Value Date/Time   COLORURINE COLORLESS (A) 06/27/2019 1210   APPEARANCEUR CLEAR 06/27/2019 1210   LABSPEC 1.031 (H) 06/27/2019 1210   PHURINE 6.0 06/27/2019 1210   GLUCOSEU >=500 (A) 06/27/2019 1210   HGBUR SMALL (A) 06/27/2019 1210   BILIRUBINUR NEGATIVE 06/27/2019 1210   KETONESUR 20 (A) 06/27/2019 1210   PROTEINUR NEGATIVE 06/27/2019 1210   NITRITE NEGATIVE 06/27/2019 1210   LEUKOCYTESUR NEGATIVE 06/27/2019 1210   Sepsis Labs: !!!!!!!!!!!!!!!!!!!!!!!!!!!!!!!!!!!!!!!!!!!! @LABRCNTIP (procalcitonin:4,lacticidven:4) )No results found for this or any previous visit (from the past 240 hour(s)).   Radiological Exams on Admission: No results found.  Reviewed outpatient UA with ED over phone: No leuks, no nitrates, Pos glucose  Assessment/Plan Principal Problem:   DKA (diabetic ketoacidoses) (HCC) Active Problems:   Type I diabetes mellitus (HCC)   Metabolic acidosis   Tachycardia   1. DKA 1. Presenting with glucose in excess of 559  with anion gap of 30, beta hydroxybutyric acid of >8 2. Insulin gtt started in ED, pt did receive IVF bolus 3. Will continue continued IVF hydration 4. Keep NPO and follow serial bmet, anticipate transition to subq insulin when no longer acidotic and gap closed 2. DM1, poorly controlled 1. Most recent a1c from 2020 demonstrates A1c of 13.4 2. Repeat a1c pending 3. Metabolic acidosis 1. Secondary to above dka 4. Tachycardia 1. Secondary to dehydrated related to above dka 2. Cont IVF and tele 5. ARF 1. Cr currently 1.19. Most recent documented Cr of 0.57 noted 2. Likely secondary to dehydration 3. Will hold nephrotoxic agents, including home ACEI for now 6. HTN 1. BP stable, albeit suboptimally controlled. Holding ACE per above, would resume when renal function and volume status improves  DVT prophylaxis: Lovenox subq    Code Status: Full Family Communication: pt in room  Disposition Plan: Uncertain at this time  Consults called:  Admission status: Inpatient given degree of metabolic acidosis requiring aggressive IVF resuscitation.    Rickey Barbara MD [facility] Pager On Amion  If 7PM-7AM, please contact night-coverage  02/03/2020, 10:05 AM

## 2020-02-03 NOTE — ED Notes (Signed)
Urinal at bedside.  

## 2020-02-03 NOTE — ED Notes (Signed)
ED TO INPATIENT HANDOFF REPORT  ED Nurse Name and Phone #: [phone]  S Name/Age/Gender Zoe BeardShayla Fox 24 y.o. female Room/Bed: WA16/WA16  Code Status   Code Status: Full Code  Home/SNF/Other Home Patient oriented to: self, place, time and situation Is this baseline? Yes   Triage Complete: Triage complete  Chief Complaint DKA (diabetic ketoacidoses) (HCC) [E11.10]  Triage Note Per patient and mother, patient was given antibiotic two days ago, patient has been vomiting since. Patient is type 1 diabetic, no pump. Last took insulin yesterday. Last ate yesterday morning. cbg in triage reads HIGH. bloodwork completed for verification.     Allergies No Known Allergies  Level of Care/Admitting Diagnosis ED Disposition    ED Disposition Condition Comment   Admit  [facility] [[id number]]  Level of Care: Stepdown [14]  Admit to SDU based on following criteria: Other see comments  Comments: DKA, acidotic  May admit patient to [facility] or [facility] if equivalent level of care is available:: Yes  Covid Evaluation: Asymptomatic Screening Protocol (No Symptoms)  Diagnosis: DKA (diabetic ketoacidoses) (HCC) [454098][[id number]]  Admitting Physician: Carolyn StareHIU, STEPHEN K [[id number]]  Attending Physician: Jerald KiefHIU, STEPHEN K [[id number]]  Estimated length of stay: 3 - 4 days  Certification:: I certify this patient will need inpatient services for at least 2 midnights       B Medical/Surgery History Past Medical History:  Diagnosis Date  . Anemia   . Anxiety   . Depression   . Diabetes mellitus without complication (HCC)   . Hypertension    No past surgical history on file.   A IV Location/Drains/Wounds Patient Lines/Drains/Airways Status    Active Line/Drains/Airways    Name Placement date Placement time Site Days   Peripheral IV 02/03/20 Antecubital 02/03/20  --  Antecubital  less than 1   Peripheral IV 02/03/20 Anterior;Right Hand 02/03/20  0900  Hand  less than 1           Intake/Output Last 24 hours No intake or output data in the 24 hours ending 02/03/20 1327  Labs/Imaging Results for orders placed or performed during the hospital encounter of 02/03/20 (from the past 48 hour(s))  CBG monitoring, ED     Status: Abnormal   Collection Time: 02/03/20  7:52 AM  Result Value Ref Range   Glucose-Capillary >600 (HH) 70 - 99 mg/dL    Comment: Glucose reference range applies only to samples taken after fasting for at least 8 hours.  Urinalysis, Routine w reflex microscopic     Status: Abnormal   Collection Time: 02/03/20  8:07 AM  Result Value Ref Range   Color, Urine STRAW (A) YELLOW   APPearance CLEAR CLEAR   Specific Gravity, Urine 1.024 1.005 - 1.030   pH 5.0 5.0 - 8.0   Glucose, UA >=500 (A) NEGATIVE mg/dL   Hgb urine dipstick MODERATE (A) NEGATIVE   Bilirubin Urine NEGATIVE NEGATIVE   Ketones, ur 80 (A) NEGATIVE mg/dL   Protein, ur 30 (A) NEGATIVE mg/dL   Nitrite NEGATIVE NEGATIVE   Leukocytes,Ua NEGATIVE NEGATIVE   RBC / HPF 11-20 0 - 5 RBC/hpf   WBC, UA 0-5 0 - 5 WBC/hpf   Bacteria, UA NONE SEEN NONE SEEN   Squamous Epithelial / LPF 0-5 0 - 5    Comment: Performed at [facility], [address] [place], [place] [id number]  Basic metabolic panel     Status: Abnormal   Collection Time: 02/03/20  8:12 AM  Result Value Ref Range   Sodium 134 (L) 135 - 145 mmol/L   Potassium 5.1 3.5 - 5.1 mmol/L   Chloride 94 (L) 98 - 111 mmol/L   CO2 10 (L) 22 - 32 mmol/L   Glucose, Bld 622 (HH) 70 - 99 mg/dL    Comment: Glucose reference range applies only to samples taken after fasting for at least 8 hours. CRITICAL RESULT CALLED TO, READ BACK BY AND VERIFIED WITH: GRIFFEN,M RN @0840  ON 02/03/20 JACKSON,K    BUN 14 6 - 20 mg/dL   Creatinine, Ser 02/05/20 (H) 0.44 - 1.00 mg/dL   Calcium 9.6 8.9 - 7.09 mg/dL   GFR calc non Af Amer >60 >60 mL/min   GFR calc Af Amer >60 >60 mL/min   Anion gap 30 (H) 5 - 15    Comment: REPEATED TO  VERIFY Performed at [facility], [address] [place], [place]   CBC     Status: Abnormal   Collection Time: 02/03/20  8:12 AM  Result Value Ref Range   WBC 6.3 4.0 - 10.5 K/uL   RBC 5.82 (H) 3.87 - 5.11 MIL/uL   Hemoglobin 14.8 12.0 - 15.0 g/dL   HCT 02/05/20 (H) 36 - 46 %   MCV 85.6 80.0 - 100.0 fL   MCH 25.4 (L) 26.0 - 34.0 pg   MCHC 29.7 (L) 30.0 - 36.0 g/dL   RDW 47.6 54.6 - 50.3 %   Platelets 322 150 - 400 K/uL   nRBC 0.0 0.0 - 0.2 %    Comment: Performed at [facility], [address], [place], [place]  I-Stat beta hCG blood, ED     Status: None   Collection Time: 02/03/20  8:22 AM  Result Value Ref Range   I-stat hCG, quantitative <5.0 <5 mIU/mL   Comment 3            Comment:   GEST. AGE      CONC.  (mIU/mL)   <=1 WEEK        5 - 50     2 WEEKS       50 - 500     3 WEEKS       100 - 10,000     4 WEEKS     1,000 - 30,000        FEMALE AND NON-PREGNANT FEMALE:     LESS THAN 5 mIU/mL   Beta-hydroxybutyric acid     Status: Abnormal   Collection Time: 02/03/20  8:29 AM  Result Value Ref Range   Beta-Hydroxybutyric Acid >8.00 (H) 0.05 - 0.27 mmol/L    Comment: RESULTS CONFIRMED BY MANUAL DILUTION Performed at [facility], [address], [place], [place]   Blood gas, venous     Status: Abnormal   Collection Time: 02/03/20  8:58 AM  Result Value Ref Range   pH, Ven 7.109 (LL) 7.25 - 7.43    Comment: CRITICAL RESULT CALLED TO, READ BACK BY AND VERIFIED WITH: CLAPP, S RN 0940 02/03/20 JM    pCO2, Ven 29.7 (L) 44 - 60 mmHg   pO2, Ven 47.9 (H) 32 - 45 mmHg   Bicarbonate 9.0 (L) 20.0 - 28.0 mmol/L   Acid-base deficit 20.1 (H) 0.0 - 2.0 mmol/L   O2 Saturation 65.5 %   Patient temperature 98.6     Comment: Performed at [facility], [address], [place], [place]  CBG monitoring,  ED     Status: Abnormal   Collection Time: 02/03/20  9:19 AM  Result Value  Ref Range   Glucose-Capillary 559 (HH) 70 - 99 mg/dL    Comment: Glucose reference range applies only to samples taken after fasting for at least 8 hours.   Comment 1 Notify RN   CBG monitoring, ED     Status: Abnormal   Collection Time: 02/03/20 10:06 AM  Result Value Ref Range   Glucose-Capillary 529 (HH) 70 - 99 mg/dL    Comment: Glucose reference range applies only to samples taken after fasting for at least 8 hours.   Comment 1 Notify RN   SARS Coronavirus 2 by RT PCR (hospital order, performed in [facility] hospital lab) Nasopharyngeal Nasopharyngeal Swab     Status: Abnormal   Collection Time: 02/03/20 10:21 AM   Specimen: Nasopharyngeal Swab  Result Value Ref Range   SARS Coronavirus 2 POSITIVE (A) NEGATIVE    Comment: RESULT CALLED TO, READ BACK BY AND VERIFIED WITH: Bergen Melle,M RN @1305  ON 02/03/20 JACKSON,K (NOTE) SARS-CoV-2 target nucleic acids are DETECTED  SARS-CoV-2 RNA is generally detectable in upper respiratory specimens  during the acute phase of infection.  Positive results are indicative  of the presence of the identified virus, but do not rule out bacterial infection or co-infection with other pathogens not detected by the test.  Clinical correlation with patient history and  other diagnostic information is necessary to determine patient infection status.  The expected result is negative.  Fact Sheet for Patients:   02/05/20   Fact Sheet for Healthcare Providers:   BoilerBrush.com.cy    This test is not yet approved or cleared by the https://pope.com/ FDA and  has been authorized for detection and/or diagnosis of SARS-CoV-2 by FDA under an Emergency Use Authorization (EUA).  This EUA will remain in effect (meaning thi s test can be used) for the duration of  the COVID-19 declaration under Section 564(b)(1) of the Act, 21 U.S.C. section 360-bbb-3(b)(1), unless the authorization is terminated or revoked  sooner.  Performed at [facility], [address] [place], [place]   Basic metabolic panel     Status: Abnormal   Collection Time: 02/03/20 10:26 AM  Result Value Ref Range   Sodium 141 135 - 145 mmol/L    Comment: DELTA CHECK NOTED   Potassium 5.4 (H) 3.5 - 5.1 mmol/L   Chloride 106 98 - 111 mmol/L   CO2 10 (L) 22 - 32 mmol/L   Glucose, Bld 283 (H) 70 - 99 mg/dL    Comment: Glucose reference range applies only to samples taken after fasting for at least 8 hours.   BUN 11 6 - 20 mg/dL   Creatinine, Ser 02/05/20 0.44 - 1.00 mg/dL   Calcium 8.6 (L) 8.9 - 10.3 mg/dL   GFR calc non Af Amer >60 >60 mL/min   GFR calc Af Amer >60 >60 mL/min   Anion gap 25 (H) 5 - 15    Comment: REPEATED TO VERIFY Performed at [facility], [address] [place], [place]   CBC     Status: Abnormal   Collection Time: 02/03/20 10:26 AM  Result Value Ref Range   WBC 7.6 4.0 - 10.5 K/uL   RBC 5.16 (H) 3.87 - 5.11 MIL/uL   Hemoglobin 13.0 12.0 - 15.0 g/dL   HCT 02/05/20 36 - 46 %   MCV 85.3 80.0 - 100.0 fL   MCH 25.2 (  L) 26.0 - 34.0 pg   MCHC 29.5 (L) 30.0 - 36.0 g/dL   RDW 04.5 40.9 - 81.1 %   Platelets 274 150 - 400 K/uL   nRBC 0.0 0.0 - 0.2 %    Comment: Performed at [facility], [address] [place], [place] [address]  CBG monitoring, ED     Status: Abnormal   Collection Time: 02/03/20 10:57 AM  Result Value Ref Range   Glucose-Capillary 499 (H) 70 - 99 mg/dL    Comment: Glucose reference range applies only to samples taken after fasting for at least 8 hours.  CBG monitoring, ED     Status: Abnormal   Collection Time: 02/03/20 12:04 PM  Result Value Ref Range   Glucose-Capillary 326 (H) 70 - 99 mg/dL    Comment: Glucose reference range applies only to samples taken after fasting for at least 8 hours.  CBG monitoring, ED     Status: Abnormal   Collection Time: 02/03/20 12:55 PM  Result Value Ref Range    Glucose-Capillary 242 (H) 70 - 99 mg/dL    Comment: Glucose reference range applies only to samples taken after fasting for at least 8 hours.   No results found.  Pending Labs Unresulted Labs (From admission, onward) Comment          Start     Ordered   02/10/20 0500  Creatinine, serum  (enoxaparin (LOVENOX)    CrCl >/= 30 ml/min)  Weekly,   R     Comments: while on enoxaparin therapy    02/03/20 1026   02/04/20 0500  Comprehensive metabolic panel  Tomorrow morning,   R        02/03/20 1026   02/04/20 0500  CBC  Tomorrow morning,   R        02/03/20 1026   02/03/20 1214  Basic metabolic panel  ONCE - STAT,   STAT        02/03/20 1213   02/03/20 1026  Basic metabolic panel  (Diabetes Ketoacidosis (DKA))  STAT Now then every 4 hours ,   STAT      02/03/20 1026   02/03/20 1026  Beta-hydroxybutyric acid  (Diabetes Ketoacidosis (DKA))  Now then every 8 hours,   R (with STAT occurrences)      02/03/20 1026   02/03/20 1026  Hemoglobin A1c  (Diabetes Ketoacidosis (DKA))  Once,   STAT       Comments: To assess prior glycemic control.    02/03/20 1026   02/03/20 0829  Beta-hydroxybutyric acid  (Diabetes Ketoacidosis (DKA))  Now then every 8 hours,   STAT      02/03/20 0830          Vitals/Pain Today's Vitals   02/03/20 1249 02/03/20 1257 02/03/20 1313 02/03/20 1324  BP: 125/83 125/83 (!) 128/104   Pulse: (!) 112 (!) 125    Resp: (!) 28 (!) 22    Temp:  98.4 F (36.9 C)  98.6 F (37 C)  TempSrc:  Oral  Oral  SpO2: 100% 97%    Weight:      Height:      PainSc:        Isolation Precautions No active isolations  Medications Medications  insulin regular, human (MYXREDLIN) 100 units/ 100 mL infusion (3.4 Units/hr Intravenous Rate/Dose Change 02/03/20 1320)  0.9 %  sodium chloride infusion (has no administration in time range)  dextrose 5 %-0.45 % sodium chloride infusion ( Intravenous New Bag/Given 02/03/20  1320)  dextrose 50 % solution 0-50 mL (has no administration in time  range)  enoxaparin (LOVENOX) injection 40 mg (has no administration in time range)  acetaminophen (TYLENOL) tablet 650 mg (has no administration in time range)    Or  acetaminophen (TYLENOL) suppository 650 mg (has no administration in time range)  ondansetron (ZOFRAN) injection 4 mg (4 mg Intravenous Given 02/03/20 0832)  sodium chloride 0.9 % bolus 1,000 mL (0 mLs Intravenous Stopped 02/03/20 0900)  lactated ringers bolus 1,000 mL (0 mLs Intravenous Stopped 02/03/20 1210)  insulin aspart (novoLOG) injection 10 Units (10 Units Subcutaneous Given 02/03/20 0839)    Mobility walks Low fall risk   Focused Assessments .   R Recommendations: See Admitting Provider Note  Report given to:   Additional Notes: n/a

## 2020-02-03 NOTE — ED Provider Notes (Signed)
[facility]-EMERGENCY DEPT Provider Note   CSN: [id number] Arrival date & time: 02/03/20  0746     History Chief Complaint  Patient presents with  . Hyperglycemia    Zoe Fox is a 24 y.o. female.  Patient is a 24 year old with type 1 diabetes, hypertension, depression, anxiety presenting to the emergency department for hyperglycemia with nausea and vomiting. Patient reports that this started 2 days ago. Patient reports that 2 days ago she went and saw primary care doctor for back pain. She was started on Macrobid at that time for presumed UTI. She is not having any urinary or vaginal symptoms. Patient reports she has had intractable vomiting since then. Denies any fever, chills, diarrhea, abdominal pain, chest pain, shortness of breath. Reports that her sugars prior to this were 200 or less. She has not missed her insulin.        Past Medical History:  Diagnosis Date  . Anemia   . Anxiety   . Depression   . Diabetes mellitus without complication (HCC)   . Hypertension     Patient Active Problem List   Diagnosis Date Noted  . Type I diabetes mellitus (HCC) 06/27/2019    No past surgical history on file.   OB History    Gravida  1   Para      Term      Preterm      AB  1   Living        SAB      TAB      Ectopic      Multiple      Live Births              Family History  Problem Relation Age of Onset  . Hypertension Mother   . Cancer Maternal Grandfather     Social History   Tobacco Use  . Smoking status: Never Smoker  . Smokeless tobacco: Never Used  Vaping Use  . Vaping Use: Former  Substance Use Topics  . Alcohol use: Never  . Drug use: Never    Home Medications Prior to Admission medications   Medication Sig Start Date End Date Taking? Authorizing Provider  enalapril (VASOTEC) 10 MG tablet Take by mouth. 12/24/15   [provider]  fluticasone (FLONASE) 50 MCG/ACT nasal spray Place 2 sprays into  both nostrils daily for 10 days. 08/17/18 08/27/18  Benay Pike, NP  insulin aspart (NOVOLOG) 100 UNIT/ML injection Inject into the skin 3 (three) times daily before meals.    [provider]  insulin glargine (LANTUS) 100 UNIT/ML injection Inject into the skin daily.    [provider]    Allergies    Patient has no known allergies.  Review of Systems   Review of Systems  Constitutional: Positive for appetite change. Negative for chills and fever.  Gastrointestinal: Positive for nausea and vomiting. Negative for abdominal pain.  Endocrine: Positive for polyuria.  Genitourinary: Negative for dysuria.  All other systems reviewed and are negative.   Physical Exam Updated Vital Signs BP (!) 132/107 (BP Location: Left Arm)   Pulse (!) 138   Temp 98.1 F (36.7 C) (Oral)   Resp 20   Ht 5\' 6"  (1.676 m)   Wt 66.7 kg   SpO2 99%   BMI 23.73 kg/m   Physical Exam Vitals and nursing note reviewed.  Constitutional:      Appearance: Normal appearance. She is underweight. She is diaphoretic.  Comments: Actively forcefully vomiting  HENT:     Head: Normocephalic and atraumatic.     Mouth/Throat:     Mouth: Mucous membranes are moist.  Eyes:     Conjunctiva/sclera: Conjunctivae normal.  Cardiovascular:     Rate and Rhythm: Regular rhythm. Tachycardia present.  Pulmonary:     Breath sounds: Normal breath sounds.     Comments: tachypnea Abdominal:     General: Abdomen is flat.     Palpations: Abdomen is soft.     Tenderness: There is no abdominal tenderness.  Skin:    General: Skin is warm.  Neurological:     Mental Status: She is alert and oriented to person, place, and time.  Psychiatric:        Mood and Affect: Mood normal.     ED Results / Procedures / Treatments   Labs (all labs ordered are listed, but only abnormal results are displayed) Labs Reviewed  CBC - Abnormal; Notable for the following components:      Result Value   RBC 5.82 (*)     HCT 49.8 (*)    MCH 25.4 (*)    MCHC 29.7 (*)    All other components within normal limits  CBG MONITORING, ED - Abnormal; Notable for the following components:   Glucose-Capillary >600 (*)    All other components within normal limits  BASIC METABOLIC PANEL  URINALYSIS, ROUTINE W REFLEX MICROSCOPIC  BLOOD GAS, VENOUS  BETA-HYDROXYBUTYRIC ACID  BETA-HYDROXYBUTYRIC ACID  CBG MONITORING, ED  I-STAT BETA HCG BLOOD, ED (MC, WL, AP ONLY)  CBG MONITORING, ED    EKG None  Radiology No results found.  Procedures Procedures (including critical care time)  Medications Ordered in ED Medications  sodium chloride 0.9 % bolus 1,000 mL (1,000 mLs Intravenous New Bag/Given 02/03/20 0832)  lactated ringers bolus 1,000 mL (has no administration in time range)  insulin aspart (novoLOG) injection 10 Units (has no administration in time range)  ondansetron (ZOFRAN) injection 4 mg (4 mg Intravenous Given 02/03/20 9892)    ED Course  I have reviewed the triage vital signs and the nursing notes.  Pertinent labs & imaging results that were available during my care of the patient were reviewed by me and considered in my medical decision making (see chart for details).  Clinical Course as of Feb 02 1014  Sun Feb 03, 2020  1014 Type I diabetic presenting with hyperglycemia, nausea, vomiting.  Appears to be in DKA.  Previously started on Macrobid 2 days ago for questionable urinary tract infection.  Although at that time her urine did not look infected.  Spoke with hospitalist and patient will be admitted for DKA.  Currently, no signs of infection.  However, her urinalysis is still pending.  White count is normal.  Patient has gotten 2 L of fluid as well as getting insulin drip at this time.   [KM]    Clinical Course User Index [KM] Jeral Pinch   MDM Rules/Calculators/A&P                          CRITICAL CARE Performed by: Arlyn Dunning   Total critical care time: 35  minutes  Critical care time was exclusive of separately billable procedures and treating other patients.  Critical care was necessary to treat or prevent imminent or life-threatening deterioration.  Critical care was time spent personally by me on the following activities: development of treatment plan with patient  and/or surrogate as well as nursing, discussions with consultants, evaluation of patient's response to treatment, examination of patient, obtaining history from patient or surrogate, ordering and performing treatments and interventions, ordering and review of laboratory studies, ordering and review of radiographic studies, pulse oximetry and re-evaluation of patient's condition.  Final Clinical Impression(s) / ED Diagnoses Final diagnoses:  None    Rx / DC Orders ED Discharge Orders    None       Jeral Pinch 02/03/20 1015    Gerhard Munch, MD 02/03/20 1259

## 2020-02-03 NOTE — ED Notes (Signed)
REQUESTED LAB ADD BETA HYDROXYBUTYRIC WITH TUBES SENT

## 2020-02-03 NOTE — ED Triage Notes (Signed)
Per patient and mother, patient was given antibiotic two days ago, patient has been vomiting since. Patient is type 1 diabetic, no pump. Last took insulin yesterday. Last ate yesterday morning. cbg in triage reads HIGH. bloodwork completed for verification.

## 2020-02-03 NOTE — ED Notes (Signed)
Date and time results received: 02/03/20 8:41 AM (use smartphrase ".now" to insert current time)  Test: blood sugar Critical Value: 622  Name of Provider Notified: [place]

## 2020-02-03 NOTE — Progress Notes (Signed)
Patient tested pos for COVID. On room air currently.  Will check CXR, baseline inflammatory markers If pos CXR findings, consider starting remdesivir. Will follow

## 2020-02-03 NOTE — Progress Notes (Signed)
I spoke with patients mother Lance Bosch via telephone,updated her and gave her ICU's phone number for updates. Melodye Ped

## 2020-02-03 NOTE — ED Notes (Signed)
Date and time results received: 02/03/20 9:40 AM  (use smartphrase ".now" to insert current time)  Test: pH Critical Value: 7.109  Name of Provider Notified: Jeraldine Loots  Orders Received? Or Actions Taken?: Actions Taken: Surveyor, mining

## 2020-02-04 LAB — GLUCOSE, CAPILLARY
Glucose-Capillary: 233 mg/dL — ABNORMAL HIGH (ref 70–99)
Glucose-Capillary: 217 mg/dL — ABNORMAL HIGH (ref 70–99)
Glucose-Capillary: 194 mg/dL — ABNORMAL HIGH (ref 70–99)
Glucose-Capillary: 185 mg/dL — ABNORMAL HIGH (ref 70–99)
Glucose-Capillary: 181 mg/dL — ABNORMAL HIGH (ref 70–99)
Glucose-Capillary: 210 mg/dL — ABNORMAL HIGH (ref 70–99)
Glucose-Capillary: 208 mg/dL — ABNORMAL HIGH (ref 70–99)
Glucose-Capillary: 171 mg/dL — ABNORMAL HIGH (ref 70–99)
Glucose-Capillary: 188 mg/dL — ABNORMAL HIGH (ref 70–99)
Glucose-Capillary: 117 mg/dL — ABNORMAL HIGH (ref 70–99)
Glucose-Capillary: 226 mg/dL — ABNORMAL HIGH (ref 70–99)
Glucose-Capillary: 187 mg/dL — ABNORMAL HIGH (ref 70–99)
Glucose-Capillary: 153 mg/dL — ABNORMAL HIGH (ref 70–99)
Glucose-Capillary: 247 mg/dL — ABNORMAL HIGH (ref 70–99)

## 2020-02-04 LAB — BASIC METABOLIC PANEL
Anion gap: 16 — ABNORMAL HIGH (ref 5–15)
BUN: 9 mg/dL (ref 6–20)
CO2: 16 mmol/L — ABNORMAL LOW (ref 22–32)
Calcium: 8.9 mg/dL (ref 8.9–10.3)
Chloride: 107 mmol/L (ref 98–111)
Creatinine, Ser: 0.52 mg/dL (ref 0.44–1.00)
GFR calc Af Amer: >60 mL/min (ref >60)
GFR calc non Af Amer: >60 mL/min (ref >60)
Glucose, Bld: 206 mg/dL — ABNORMAL HIGH (ref 70–99)
Potassium: 3.1 mmol/L — ABNORMAL LOW (ref 3.5–5.1)
Sodium: 139 mmol/L (ref 135–145)

## 2020-02-04 LAB — COMPREHENSIVE METABOLIC PANEL
ALT: 48 U/L — ABNORMAL HIGH (ref 0–44)
AST: 49 U/L — ABNORMAL HIGH (ref 15–41)
Albumin: 3.5 g/dL (ref 3.5–5.0)
Alkaline Phosphatase: 80 U/L (ref 38–126)
Anion gap: 11 (ref 5–15)
BUN: 8 mg/dL (ref 6–20)
CO2: 19 mmol/L — ABNORMAL LOW (ref 22–32)
Calcium: 8.8 mg/dL — ABNORMAL LOW (ref 8.9–10.3)
Chloride: 108 mmol/L (ref 98–111)
Creatinine, Ser: 0.55 mg/dL (ref 0.44–1.00)
GFR calc Af Amer: >60 mL/min (ref >60)
GFR calc non Af Amer: >60 mL/min (ref >60)
Glucose, Bld: 191 mg/dL — ABNORMAL HIGH (ref 70–99)
Potassium: 3.2 mmol/L — ABNORMAL LOW (ref 3.5–5.1)
Sodium: 138 mmol/L (ref 135–145)
Total Bilirubin: 0.3 mg/dL (ref 0.3–1.2)
Total Protein: 6.8 g/dL (ref 6.5–8.1)

## 2020-02-04 LAB — CBC
HCT: 42.2 % (ref 36.0–46.0)
Hemoglobin: 13.4 g/dL (ref 12.0–15.0)
MCH: 25.7 pg — ABNORMAL LOW (ref 26.0–34.0)
MCHC: 31.8 g/dL (ref 30.0–36.0)
MCV: 80.8 fL (ref 80.0–100.0)
Platelets: 250 10*3/uL (ref 150–400)
RBC: 5.22 MIL/uL — ABNORMAL HIGH (ref 3.87–5.11)
RDW: 12.6 % (ref 11.5–15.5)
WBC: 7.6 10*3/uL (ref 4.0–10.5)
nRBC: 0 % (ref 0.0–0.2)

## 2020-02-04 LAB — FERRITIN: Ferritin: 112 ng/mL (ref 11–307)

## 2020-02-04 LAB — BETA-HYDROXYBUTYRIC ACID: Beta-Hydroxybutyric Acid: 1.5 mmol/L — ABNORMAL HIGH (ref 0.05–0.27)

## 2020-02-04 LAB — C-REACTIVE PROTEIN: CRP: 0.5 mg/dL (ref <1.0)

## 2020-02-04 LAB — D-DIMER, QUANTITATIVE: D-Dimer, Quant: 0.87 ug/mL-FEU — ABNORMAL HIGH (ref 0.00–0.50)

## 2020-02-04 MED ORDER — INSULIN GLARGINE 100 UNIT/ML ~~LOC~~ SOLN
30.0000 [IU] | Freq: Every day | SUBCUTANEOUS | Status: DC
  Administered 2020-02-04: 30 [IU] via SUBCUTANEOUS
  Filled 2020-02-04: qty 0.3

## 2020-02-04 MED ORDER — POTASSIUM CHLORIDE 10 MEQ/100ML IV SOLN
10.0000 meq | INTRAVENOUS | Status: AC
  Administered 2020-02-04: 10 meq via INTRAVENOUS
  Filled 2020-02-04 (×2): qty 100

## 2020-02-04 MED ORDER — NOVOLOG FLEXPEN 100 UNIT/ML ~~LOC~~ SOPN: 5.0000 [IU] | PEN_INJECTOR | Freq: Three times a day (TID) | SUBCUTANEOUS | 11 refills | Status: AC

## 2020-02-04 MED ORDER — INSULIN ASPART 100 UNIT/ML ~~LOC~~ SOLN
12.0000 [IU] | Freq: Once | SUBCUTANEOUS | Status: AC
  Administered 2020-02-04: 12 [IU] via SUBCUTANEOUS

## 2020-02-04 MED ORDER — INSULIN ASPART 100 UNIT/ML ~~LOC~~ SOLN
0.0000 [IU] | Freq: Three times a day (TID) | SUBCUTANEOUS | Status: DC
  Administered 2020-02-05: 9 [IU] via SUBCUTANEOUS
  Administered 2020-02-05 (×2): 7 [IU] via SUBCUTANEOUS

## 2020-02-04 MED ORDER — ASCORBIC ACID 500 MG PO TABS: 500.0000 mg | ORAL_TABLET | Freq: Every day | ORAL | 1 refills | Status: AC

## 2020-02-04 MED ORDER — INSULIN ASPART 100 UNIT/ML ~~LOC~~ SOLN
2.0000 [IU] | Freq: Three times a day (TID) | SUBCUTANEOUS | Status: DC
  Administered 2020-02-05 (×3): 2 [IU] via SUBCUTANEOUS

## 2020-02-04 MED ORDER — INSULIN GLARGINE 100 UNIT/ML ~~LOC~~ SOLN: 30.0000 [IU] | Freq: Every day | SUBCUTANEOUS | 11 refills | Status: DC

## 2020-02-04 MED ORDER — INSULIN GLARGINE 100 UNIT/ML ~~LOC~~ SOLN
24.0000 [IU] | Freq: Every day | SUBCUTANEOUS | Status: DC
  Administered 2020-02-04 – 2020-02-05 (×2): 24 [IU] via SUBCUTANEOUS
  Filled 2020-02-04 (×2): qty 0.24

## 2020-02-04 MED ORDER — ENALAPRIL MALEATE 10 MG PO TABS
10.0000 mg | ORAL_TABLET | Freq: Every day | ORAL | Status: DC
  Administered 2020-02-04 – 2020-02-06 (×3): 10 mg via ORAL
  Filled 2020-02-04 (×3): qty 1

## 2020-02-04 MED ORDER — INSULIN ASPART 100 UNIT/ML ~~LOC~~ SOLN: 0.0000 [IU] | Freq: Three times a day (TID) | SUBCUTANEOUS | Status: DC

## 2020-02-04 MED ORDER — POTASSIUM CHLORIDE CRYS ER 20 MEQ PO TBCR
40.0000 meq | EXTENDED_RELEASE_TABLET | Freq: Every day | ORAL | Status: DC
  Administered 2020-02-04: 40 meq via ORAL
  Filled 2020-02-04: qty 2

## 2020-02-04 MED ORDER — NITROFURANTOIN MONOHYD MACRO 100 MG PO CAPS: 100.0000 mg | ORAL_CAPSULE | Freq: Every day | ORAL | Status: DC

## 2020-02-04 NOTE — Progress Notes (Signed)
Turned off Insulin drip, and covered CBG.  Patient is still nauseous and not comfortable ambulating in room.  Said she was not comfortable going home today. Relayed message to Hospitalist and he will transfer to MedSurg and monitor one more night.

## 2020-02-04 NOTE — Progress Notes (Signed)
DC tele and medsurge bed request.

## 2020-02-04 NOTE — Progress Notes (Signed)
PROGRESS NOTE    Zoe Fox  TKZ:[id number] DOB: 1995/08/16 DOA: 02/03/2020 PCP: Patient, No Pcp Per  Brief Narrative:  24 year old type I diabetic previously followed aggressively by [facility] pediatric Dr. Brooke Bonito guard  Spontaneous miscarriage 06/2019-on no contraception Seen at urgent care 01/31/2020 back pain lower extremity pain cramping Felt that she might of had a UTI therefore was given Macrobid 100 twice daily 10 days, Toradol injection and told to follow-up Admitted to stepdown unit and unfortunately coincidently found to have nonsymptomatic coronavirus positive state COVID-19  Assessment & Plan:   Principal Problem:   DKA (diabetic ketoacidoses) (HCC) Active Problems:   Type I diabetes mellitus (HCC)   Metabolic acidosis   Tachycardia   1. DKA a. Still feeling poorly and just transitioned off insulin GTT and will need to reliably eat several meals prior to being discharged home b. Sugars are well controlled anion gap is closed and I am starting phase 2 orders 2. Coronavirus 19 a. Asymptomatic b. Given presentation to hospital will need at least 14 days isolation c. updated [facility] on [address]--they were made aware of her being Covid +  3. BMI 23 4. Malnutrition a. Need counseling re: the same 5. Diabetes diagnosed at Age11 6. stg 1-2 htn a. Monitor--needs OP Retinopathy, nephropathy screening b. continue ACE and need to be seen by a new PCP 7. Medical noncompliance  DVT prophylaxis: lovenox Code Status: full Family Communication:  called mother She still sees [facility]--has no PCP She will be moving to [place] in a cpl weeks schedules got "out of whack" She hasn't been hosp since first diagnosed  Disposition:  Home in 1-2 days   Status is: Inpatient  Remains inpatient appropriate because:Altered mental status and Unsafe d/c plan   Dispo: The patient is from: Home              Anticipated d/c is to: Home               Anticipated d/c date is: 1 day              Patient currently is not medically stable to d/c.  Consultants:   n  Procedures: n  Antimicrobials: n    Subjective: Awake alert seems quite week in and no focal deficit No sob no chill no rigor  Objective: Vitals:   02/04/20 1200 02/04/20 1300 02/04/20 1400 02/04/20 1500  BP: (!) 158/110 (!) 152/100 140/89 (!) 135/101  Pulse: (!) 115 (!) 102 (!) 101 (!) 113  Resp: 12 18 (!) 0 16  Temp:      TempSrc:      SpO2: 90% 96% 99% 98%  Weight:      Height:        Intake/Output Summary (Last 24 hours) at 02/04/2020 1631 Last data filed at 02/04/2020 0600 Gross per 24 hour  Intake 1205.04 ml  Output --  Net 1205.04 ml   Filed Weights   02/03/20 0803 02/03/20 1423  Weight: 66.7 kg 59.1 kg    Examination:  General exam: eomi ncat no focal deficit Respiratory system: cta b no added sound, clear Cardiovascular system: s1 s 2no m/r/g Gastrointestinal system: Soft nontender no rebound no guarding. Central nervous system: Neurologically intact no focal deficit moving all 4 limbs Extremities: No lower extremity edema Skin: No lower extremity edema no rash no no swelling Psychiatry: Flat affect  Data Reviewed: I have personally reviewed following labs and imaging studies Potassium 3.2 CO2 19 anion gap 11  AST/ALT 49/48 CRP 0.5 Hemoglobin 13.4 platelet 250  Radiology Studies: DG CHEST PORT 1 VIEW  Result Date: 02/03/2020 CLINICAL DATA:  Nausea, vomiting. EXAM: PORTABLE CHEST 1 VIEW COMPARISON:  None. FINDINGS: The heart size and mediastinal contours are within normal limits. Both lungs are clear. The visualized skeletal structures are unremarkable. IMPRESSION: No active disease. Electronically Signed   By: Lupita Raider M.D.   On: 02/03/2020 14:30   Scheduled Meds: . vitamin C  500 mg Oral Daily  . chlorhexidine  15 mL Mouth Rinse BID  . Chlorhexidine Gluconate Cloth  6 each Topical Daily  . enalapril  10 mg Oral Daily  .  enoxaparin (LOVENOX) injection  40 mg Subcutaneous Q24H  . insulin aspart  0-15 Units Subcutaneous TID AC  . insulin glargine  30 Units Subcutaneous Daily  . mouth rinse  15 mL Mouth Rinse q12n4p  . nitrofurantoin (macrocrystal-monohydrate)  100 mg Oral QHS  . pneumococcal 23 valent vaccine  0.5 mL Intramuscular Tomorrow-1000  . potassium chloride  40 mEq Oral Daily  . zinc sulfate  220 mg Oral Daily   Continuous Infusions: . sodium chloride Stopped (02/03/20 1030)  . dextrose 5 % and 0.45% NaCl 75 mL/hr at 02/03/20 1320  . insulin 7.5 Units/hr (02/04/20 1252)     LOS: 1 day    Time spent: 20  Rhetta Mura, MD [facility] To contact the attending provider between 7A-7P or the covering provider during after hours 7P-7A, please log into the web site [email or web address] and access using universal [place] password for that web site. If you do not have the password, please call the hospital operator.  02/04/2020, 4:31 PM

## 2020-02-04 NOTE — TOC Transition Note (Signed)
Transition of Care [facility]) - CM/SW Discharge Note   Patient Details  Name: Zoe Fox MRN: [id number] Date of Birth: [birth year]  [facility]) CM/SW Contact:  Golda Acre, RN Phone Number: 02/04/2020, 1:32 PM   Clinical Narrative:    dcd to home with self care.   Final next level of care: Home/Self Care Barriers to Discharge: No Barriers Identified   Patient Goals and CMS Choice Patient states their goals for this hospitalization and ongoing recovery are:: to go home CMS Medicare.gov Compare Post Acute Care list provided to:: Patient    Discharge Placement                       Discharge Plan and Services   Discharge Planning Services: CM Consult                                 Social Determinants of Health (SDOH) Interventions     Readmission Risk Interventions No flowsheet data found.

## 2020-02-04 NOTE — Progress Notes (Signed)
Updated mother about [place] staying tonight and transition off insulin drip.

## 2020-02-04 NOTE — Progress Notes (Signed)
Inpatient Diabetes Program Recommendations  AACE/ADA: New Consensus Statement on Inpatient Glycemic Control (2015)  Target Ranges:  Prepandial:   less than 140 mg/dL      Peak postprandial:   less than 180 mg/dL (1-2 hours)      Critically ill patients:  140 - 180 mg/dL   Lab Results  Component Value Date   GLUCAP 117 (H) 02/04/2020   HGBA1C 13.4 (H) 07/03/2019    Review of Glycemic Control  Diabetes history: DM1 Outpatient Diabetes medications: Lantus 30 units QD, Novolog 0-15 units tid, CF 25. Blood sugar goal 125 mg/dL Current orders for Inpatient glycemic control: IV insulin transitioned to Lantus 30 units QD, Novolog 0-9 units tidwc + 2 units tidwc  Inpatient Diabetes Program Recommendations:     Lantus 24 units QHS Increase Novolog to 4 units tidwc  Spoke with pt regarding HgbA1C of 13.4%. Pt was moaning and didn't feel like talking. Said she sometimes misses insulin doses and check blood sugars several times/day.  Will speak with pt in am. Will need f/u with Endo after hospitalization.  Thank you. Ailene Ards, RD, LDN, CDE Inpatient Diabetes Coordinator [phone]

## 2020-02-05 LAB — COMPREHENSIVE METABOLIC PANEL
ALT: 37 U/L (ref 0–44)
AST: 35 U/L (ref 15–41)
Albumin: 3.1 g/dL — ABNORMAL LOW (ref 3.5–5.0)
Alkaline Phosphatase: 82 U/L (ref 38–126)
Anion gap: 11 (ref 5–15)
BUN: 8 mg/dL (ref 6–20)
CO2: 21 mmol/L — ABNORMAL LOW (ref 22–32)
Calcium: 8.5 mg/dL — ABNORMAL LOW (ref 8.9–10.3)
Chloride: 104 mmol/L (ref 98–111)
Creatinine, Ser: 0.59 mg/dL (ref 0.44–1.00)
GFR calc Af Amer: >60 mL/min (ref >60)
GFR calc non Af Amer: >60 mL/min (ref >60)
Glucose, Bld: 352 mg/dL — ABNORMAL HIGH (ref 70–99)
Potassium: 2.8 mmol/L — ABNORMAL LOW (ref 3.5–5.1)
Sodium: 136 mmol/L (ref 135–145)
Total Bilirubin: 0.5 mg/dL (ref 0.3–1.2)
Total Protein: 6.3 g/dL — ABNORMAL LOW (ref 6.5–8.1)

## 2020-02-05 LAB — GLUCOSE, CAPILLARY
Glucose-Capillary: 355 mg/dL — ABNORMAL HIGH (ref 70–99)
Glucose-Capillary: 345 mg/dL — ABNORMAL HIGH (ref 70–99)
Glucose-Capillary: 328 mg/dL — ABNORMAL HIGH (ref 70–99)
Glucose-Capillary: 365 mg/dL — ABNORMAL HIGH (ref 70–99)

## 2020-02-05 LAB — FERRITIN: Ferritin: 104 ng/mL (ref 11–307)

## 2020-02-05 LAB — C-REACTIVE PROTEIN: CRP: 2.8 mg/dL — ABNORMAL HIGH (ref <1.0)

## 2020-02-05 LAB — HEMOGLOBIN A1C
Hgb A1c MFr Bld: >15.5 % — ABNORMAL HIGH (ref 4.8–5.6)
Mean Plasma Glucose: >398 mg/dL

## 2020-02-05 LAB — D-DIMER, QUANTITATIVE: D-Dimer, Quant: 1.33 ug/mL-FEU — ABNORMAL HIGH (ref 0.00–0.50)

## 2020-02-05 LAB — MAGNESIUM: Magnesium: 1.9 mg/dL (ref 1.7–2.4)

## 2020-02-05 MED ORDER — INSULIN GLARGINE 100 UNIT/ML ~~LOC~~ SOLN
10.0000 [IU] | Freq: Once | SUBCUTANEOUS | Status: AC
  Administered 2020-02-05: 10 [IU] via SUBCUTANEOUS
  Filled 2020-02-05: qty 0.1

## 2020-02-05 MED ORDER — SUCRALFATE 1 GM/10ML PO SUSP
1.0000 g | Freq: Three times a day (TID) | ORAL | Status: DC
  Administered 2020-02-05 – 2020-02-06 (×5): 1 g via ORAL
  Filled 2020-02-05 (×5): qty 10

## 2020-02-05 MED ORDER — ADULT MULTIVITAMIN W/MINERALS CH
1.0000 | ORAL_TABLET | Freq: Every day | ORAL | Status: DC
  Administered 2020-02-05 – 2020-02-06 (×2): 1 via ORAL
  Filled 2020-02-05 (×2): qty 1

## 2020-02-05 MED ORDER — POTASSIUM CHLORIDE 10 MEQ/100ML IV SOLN
10.0000 meq | INTRAVENOUS | Status: AC
  Administered 2020-02-05 (×3): 10 meq via INTRAVENOUS
  Filled 2020-02-05 (×3): qty 100

## 2020-02-05 MED ORDER — PROSOURCE PLUS PO LIQD
30.0000 mL | Freq: Two times a day (BID) | ORAL | Status: DC
  Administered 2020-02-05 – 2020-02-06 (×2): 30 mL via ORAL
  Filled 2020-02-05 (×3): qty 30

## 2020-02-05 MED ORDER — PANTOPRAZOLE SODIUM 40 MG PO TBEC
40.0000 mg | DELAYED_RELEASE_TABLET | Freq: Every day | ORAL | Status: DC
  Administered 2020-02-05 – 2020-02-06 (×2): 40 mg via ORAL
  Filled 2020-02-05 (×2): qty 1

## 2020-02-05 MED ORDER — GUAIFENESIN-DM 100-10 MG/5ML PO SYRP: 5.0000 mL | ORAL_SOLUTION | ORAL | Status: DC | PRN

## 2020-02-05 MED ORDER — POTASSIUM CHLORIDE CRYS ER 20 MEQ PO TBCR
40.0000 meq | EXTENDED_RELEASE_TABLET | Freq: Two times a day (BID) | ORAL | Status: DC
  Administered 2020-02-05 – 2020-02-06 (×3): 40 meq via ORAL
  Filled 2020-02-05 (×3): qty 2

## 2020-02-05 NOTE — Progress Notes (Signed)
PROGRESS NOTE    Zoe Fox  TKW:[id number] DOB: May 25, 1996 DOA: 02/03/2020 PCP: Patient, No Pcp Per  Brief Narrative:  24 year old type I diabetic previously followed aggressively by [facility] pediatric Dr. Brooke Bonito guard  Spontaneous miscarriage 06/2019-on no contraception Seen at urgent care 01/31/2020 back pain lower extremity pain cramping Felt that she might of had a UTI therefore was given Macrobid 100 twice daily 10 days, Toradol injection and told to follow-up Admitted to stepdown unit and unfortunately coincidently found to have nonsymptomatic coronavirus positive state COVID-19  Patient improved but has had increases in her inflammatory markers and felt subjectively somewhat short of breath therefore will observe another day in the hospital to ensure she does not clinically worsen  Assessment & Plan:   Principal Problem:   DKA (diabetic ketoacidoses) (HCC) Active Problems:   Type I diabetes mellitus (HCC)   Metabolic acidosis   Tachycardia   1. DKA 2. Usually well controlled DM TY 1 since age of 70 a. Still feeling poorly and has not really eaten b. Sugars are elevated in the 3 45-3 55 range-she has received 24 of Lantus already today c. Nursing to give 10 units extra Lantus today and continue resistant scale d. Will need slightly better control prior to discharge e.  patient will follow with [facility] endocrinology in the outpatient setting and mother stated she will help find a PCP for her 3. Coronavirus 19 a. rise in inflammatory markers is a little bit concerning b. Given presentation to hospital w qualifies for severe infection, needs 21 days quarantine c. updated [facility] on [address]--they were made aware of her being Covid +  d. Watch closely over the course of the next day or 2 e. Consideration for initiation remdesivir and steroids and if needed will need to control her glycemia with higher doses of insulin given she came in with DKA on  admission 4. BMI 23 5. Malnutrition a. Need counseling re: the same 6. stg 1-2 htn a. Monitor--needs OP Retinopathy, nephropathy screening b. continue ACE and need to be seen by a new PCP 7. Hypokalemia a. Replace with K. Dur 40 twice daily today after rounds of IV b. Magnesium is okay c. Recheck labs in the morning   DVT prophylaxis: lovenox Code Status: full Family Communication:  Mother 02/04/2020 and had a long discussion  Disposition:  Home in 1-2 days   Status is: Inpatient  Remains inpatient appropriate because:Altered mental status and Unsafe d/c plan   Dispo: The patient is from: Home              Anticipated d/c is to: Home              Anticipated d/c date is: 1 day              Patient currently is not medically stable to d/c.  Consultants:   n  Procedures: n Antimicrobials: n   Subjective: Still pretty weak and only ambulated out of the bed once did not require oxygen but is having some difficulty with eating today No chest pain No fever no chills No nausea no vomiting Tells me she has some abdominal pain and prior to coming in she had 3-4 episodes of vomiting  Objective: Vitals:   02/05/20 0146 02/05/20 0535 02/05/20 0929 02/05/20 1402  BP: 111/72 123/89 120/86 114/76  Pulse: 86 87 91 88  Resp: 16 15 14 14   Temp: 98.8 F (37.1 C) 98.5 F (36.9 C) 98.3 F (36.8 C)  98.7 F (37.1 C)  TempSrc: Oral Oral Oral Oral  SpO2: 100%  100% 100%  Weight:      Height:        Intake/Output Summary (Last 24 hours) at 02/05/2020 1529 Last data filed at 02/05/2020 1416 Gross per 24 hour  Intake 2200 ml  Output --  Net 2200 ml   Filed Weights   02/03/20 0803 02/03/20 1423 02/04/20 2314  Weight: 66.7 kg 59.1 kg 61.1 kg    Examination:  General exam: eomi ncat no focal deficit Respiratory system: cta b no added sound, clear Cardiovascular system: s1 s 2no m/r/g Gastrointestinal system: Mild central epigastric tenderness Central nervous system:  Neurologically intact no focal deficit moving all 4 limbs Extremities: No lower extremity edema Skin: No lower extremity edema no rash no no swelling Psychiatry: Flat affect  Data Reviewed: I have personally reviewed following labs and imaging studies Potassium 3.2 down to 2.8 Magnesium 1.9 CO2 19-->21 Anion gap 11 Hemoglobin 13.4 platelet 250  COVID-19 Labs  Recent Labs    02/03/20 1452 02/04/20 0316 02/05/20 0418  DDIMER 0.51* 0.87* 1.33*  FERRITIN 142 112 104  CRP 0.6 0.5 2.8*    Lab Results  Component Value Date   SARSCOV2NAA POSITIVE (A) 02/03/2020     Radiology Studies: No results found. Scheduled Meds: . (feeding supplement) PROSource Plus  30 mL Oral BID BM  . vitamin C  500 mg Oral Daily  . chlorhexidine  15 mL Mouth Rinse BID  . Chlorhexidine Gluconate Cloth  6 each Topical Daily  . enalapril  10 mg Oral Daily  . insulin aspart  0-9 Units Subcutaneous TID WC  . insulin aspart  2 Units Subcutaneous TID WC  . insulin glargine  10 Units Subcutaneous Once  . insulin glargine  24 Units Subcutaneous QHS  . mouth rinse  15 mL Mouth Rinse q12n4p  . multivitamin with minerals  1 tablet Oral Daily  . pneumococcal 23 valent vaccine  0.5 mL Intramuscular Tomorrow-1000  . potassium chloride  40 mEq Oral BID  . zinc sulfate  220 mg Oral Daily   Continuous Infusions: . sodium chloride Stopped (02/03/20 1030)     LOS: 2 days    Time spent: 20  Rhetta Mura, MD [facility] To contact the attending provider between 7A-7P or the covering provider during after hours 7P-7A, please log into the web site [email or web address] and access using universal [place] password for that web site. If you do not have the password, please call the hospital operator.  02/05/2020, 3:29 PM

## 2020-02-05 NOTE — Progress Notes (Signed)
Inpatient Diabetes Program Recommendations  AACE/ADA: New Consensus Statement on Inpatient Glycemic Control (2015)  Target Ranges:  Prepandial:   less than 140 mg/dL      Peak postprandial:   less than 180 mg/dL (1-2 hours)      Critically ill patients:  140 - 180 mg/dL   Lab Results  Component Value Date   GLUCAP 355 (H) 02/05/2020   HGBA1C 13.4 (H) 07/03/2019    Review of Glycemic Control  CBGs this am: 352, 355 mg/dL  Inpatient Diabetes Program Recommendations:     Increase Lantus to 30 units QHS Increase Novolog to 4 units tidwc for meal coverage insulin.  Will follow glucose trends.  Thank you. Ailene Ards, RD, LDN, CDE Inpatient Diabetes Coordinator [phone]

## 2020-02-05 NOTE — Progress Notes (Signed)
Initial Nutrition Assessment  RD working remotely.  DOCUMENTATION CODES:   Not applicable  INTERVENTION:  - will order 30 ml Prosource Plus BID, each supplement provides 100 kcal and 15 grams protein. - will order 1 tablet multivitamin with minerals/day. - recommend referral to [facility] or outpatient center in [place] once patient is no longer COVID-19 positive.  NUTRITION DIAGNOSIS:   Inadequate oral intake related to acute illness, nausea, vomiting as evidenced by per patient/family report.  GOAL:   Patient will meet greater than or equal to 90% of their needs  MONITOR:   PO intake, Supplement acceptance, Labs, Weight trends  REASON FOR ASSESSMENT:   Malnutrition Screening Tool  ASSESSMENT:   24 y.o. female with medical history of type 1 DM, depression, and anxiety. She presented to the ED with hyperglycemia. She was recently seen at Urgent Care due to back/flank pain x2 days and was diagnosed with UTI, given mactrobid. Since starting abx, patient reported intractable n/v, unable to tolerate drinks or trial of chicken noodle soup. She reported compliance with insulin regimen.  RD is working off site today. Diet advanced from NPO to Heart Healthy/Carb Modified yesterday at 0730. Patient reports that N/V has now resolved and she is able to eat full meals without issue. She had a large breakfast, lunch has not yet been delivered.   Weight yesterday was 134 lb and PTA the most recently documented weight was on 07/30/19 when she weighed 147 lb. This indicates 13 lb weight loss (8.8% body weight) in the past 7 months; not significant for time frame.   Per notes:  - DKA on admission--dx with type 1 DM at age 30 and this is first hospitalization since that time - COVID-19 positive--asymptomatic  - patient reported to MD that she is moving to [facility] in a few weeks and follows with an Endocrinologist there but has no PCP - plan at time of d/c is  for home   Labs reviewed; HgbA1c: 13.4%, CBGs: 355 and 345 mg/dl, K: 2.8 mmol/l, Ca: 8.5 mg/dl. Medications reviewed; 500 mg ascorbic acid/day, sliding scale novolog, 12 units novolog x1 dose 7/19, 2 units novolog TID, 24 units lantus/day, 10 mEq IV KCl x3 runs 7/20, 40 mEq Klor-Con BID, 220 mg zinc sulfate/day.  IVF; NS @ 50 ml/hr.    NUTRITION - FOCUSED PHYSICAL EXAM:  unable to complete at this time.   Diet Order:   Diet Order            Diet heart healthy/carb modified Room service appropriate? Yes; Fluid consistency: Thin  Diet effective now           Diet - low sodium heart healthy                 EDUCATION NEEDS:   Education needs have been addressed  Skin:  Skin Assessment: Reviewed RN Assessment  Last BM:  7/18  Height:   Ht Readings from Last 1 Encounters:  02/04/20 5\' 2"  (1.575 m)    Weight:   Wt Readings from Last 1 Encounters:  02/04/20 61.1 kg    Estimated Nutritional Needs:  Kcal:  1850-2000 kcal Protein:  90-100 grams Fluid:  >/= 2 L/day     02/06/20, MS, RD, LDN, CNSC Inpatient Clinical Dietitian RD pager # available in [facility]  After hours/weekend pager # available in [facility]

## 2020-02-06 DIAGNOSIS — E1065 Type 1 diabetes mellitus with hyperglycemia: Secondary | ICD-10-CM

## 2020-02-06 LAB — BASIC METABOLIC PANEL
Anion gap: 8 (ref 5–15)
BUN: 13 mg/dL (ref 6–20)
CO2: 24 mmol/L (ref 22–32)
Calcium: 8.5 mg/dL — ABNORMAL LOW (ref 8.9–10.3)
Chloride: 108 mmol/L (ref 98–111)
Creatinine, Ser: 0.55 mg/dL (ref 0.44–1.00)
GFR calc Af Amer: >60 mL/min (ref >60)
GFR calc non Af Amer: >60 mL/min (ref >60)
Glucose, Bld: 157 mg/dL — ABNORMAL HIGH (ref 70–99)
Potassium: 3.4 mmol/L — ABNORMAL LOW (ref 3.5–5.1)
Sodium: 140 mmol/L (ref 135–145)

## 2020-02-06 LAB — FERRITIN: Ferritin: 121 ng/mL (ref 11–307)

## 2020-02-06 LAB — GLUCOSE, CAPILLARY
Glucose-Capillary: 337 mg/dL — ABNORMAL HIGH (ref 70–99)
Glucose-Capillary: 163 mg/dL — ABNORMAL HIGH (ref 70–99)
Glucose-Capillary: 150 mg/dL — ABNORMAL HIGH (ref 70–99)
Glucose-Capillary: 251 mg/dL — ABNORMAL HIGH (ref 70–99)

## 2020-02-06 LAB — C-REACTIVE PROTEIN: CRP: 2 mg/dL — ABNORMAL HIGH (ref <1.0)

## 2020-02-06 LAB — D-DIMER, QUANTITATIVE: D-Dimer, Quant: 1.37 ug/mL-FEU — ABNORMAL HIGH (ref 0.00–0.50)

## 2020-02-06 LAB — HEMOGLOBIN A1C
Hgb A1c MFr Bld: >15.5 % — ABNORMAL HIGH (ref 4.8–5.6)
Mean Plasma Glucose: >398 mg/dL

## 2020-02-06 MED ORDER — INSULIN ASPART 100 UNIT/ML ~~LOC~~ SOLN
4.0000 [IU] | Freq: Three times a day (TID) | SUBCUTANEOUS | Status: DC
  Administered 2020-02-06 (×2): 4 [IU] via SUBCUTANEOUS

## 2020-02-06 MED ORDER — EPINEPHRINE 0.3 MG/0.3ML IJ SOAJ
0.3000 mg | Freq: Once | INTRAMUSCULAR | Status: DC | PRN
  Filled 2020-02-06: qty 0.6

## 2020-02-06 MED ORDER — INSULIN GLARGINE 100 UNIT/ML ~~LOC~~ SOLN
30.0000 [IU] | Freq: Every day | SUBCUTANEOUS | Status: DC
  Filled 2020-02-06: qty 0.3

## 2020-02-06 MED ORDER — SODIUM CHLORIDE 0.9 % IV SOLN
Freq: Once | INTRAVENOUS | Status: AC
  Filled 2020-02-06: qty 5

## 2020-02-06 MED ORDER — INSULIN ASPART 100 UNIT/ML ~~LOC~~ SOLN
0.0000 [IU] | Freq: Three times a day (TID) | SUBCUTANEOUS | Status: DC
  Administered 2020-02-06: 8 [IU] via SUBCUTANEOUS
  Administered 2020-02-06: 2 [IU] via SUBCUTANEOUS

## 2020-02-06 MED ORDER — INSULIN ASPART 100 UNIT/ML ~~LOC~~ SOLN: 0.0000 [IU] | Freq: Every day | SUBCUTANEOUS | Status: DC

## 2020-02-06 MED ORDER — SODIUM CHLORIDE 0.9 % IV SOLN: INTRAVENOUS | Status: DC | PRN

## 2020-02-06 MED ORDER — DIPHENHYDRAMINE HCL 50 MG/ML IJ SOLN: 50.0000 mg | Freq: Once | INTRAMUSCULAR | Status: DC | PRN

## 2020-02-06 MED ORDER — METHYLPREDNISOLONE SODIUM SUCC 125 MG IJ SOLR: 125.0000 mg | Freq: Once | INTRAMUSCULAR | Status: DC | PRN

## 2020-02-06 MED ORDER — FAMOTIDINE IN NACL 20-0.9 MG/50ML-% IV SOLN: 20.0000 mg | Freq: Once | INTRAVENOUS | Status: DC | PRN

## 2020-02-06 MED ORDER — ALBUTEROL SULFATE HFA 108 (90 BASE) MCG/ACT IN AERS: 2.0000 | INHALATION_SPRAY | Freq: Once | RESPIRATORY_TRACT | Status: DC | PRN

## 2020-02-06 NOTE — Progress Notes (Signed)
Written and verbal discharge instructions were given to patient. IV's removed and personal belongings are with her at bedside.   Patient refused pneumococcal vaccine.

## 2020-02-06 NOTE — Discharge Instructions (Addendum)
10 Things You Can Do to Manage Your COVID-19 Symptoms at Home If you have possible or confirmed COVID-19: 1. Stay home from work and school. And stay away from other public places. If you must go out, avoid using any kind of public transportation, ridesharing, or taxis. 2. Monitor your symptoms carefully. If your symptoms get worse, call your healthcare provider immediately. 3. Get rest and stay hydrated. 4. If you have a medical appointment, call the healthcare provider ahead of time and tell them that you have or may have COVID-19. 5. For medical emergencies, call 911 and notify the dispatch personnel that you have or may have COVID-19. 6. Cover your cough and sneezes with a tissue or use the inside of your elbow. 7. Wash your hands often with soap and water for at least 20 seconds or clean your hands with an alcohol-based hand sanitizer that contains at least 60% alcohol. 8. As much as possible, stay in a specific room and away from other people in your home. Also, you should use a separate bathroom, if available. If you need to be around other people in or outside of the home, wear a mask. 9. Avoid sharing personal items with other people in your household, like dishes, towels, and bedding. 10. Clean all surfaces that are touched often, like counters, tabletops, and doorknobs. Use household cleaning sprays or wipes according to the label instructions. [email or web address] 01/17/2019 This information is not intended to replace advice given to you by your health care provider. Make sure you discuss any questions you have with your health care provider. Document Revised: 06/21/2019 Document Reviewed: 06/21/2019 Elsevier Patient Education  2020 [place].   COVID-19 COVID-19 is a respiratory infection that is caused by a virus called severe acute respiratory syndrome coronavirus 2 (SARS-CoV-2). The disease is also known as coronavirus disease or novel coronavirus. In some people, the virus  may not cause any symptoms. In others, it may cause a serious infection. The infection can get worse quickly and can lead to complications, such as:  Pneumonia, or infection of the lungs.  Acute respiratory distress syndrome or ARDS. This is a condition in which fluid build-up in the lungs prevents the lungs from filling with air and passing oxygen into the blood.  Acute respiratory failure. This is a condition in which there is not enough oxygen passing from the lungs to the body or when carbon dioxide is not passing from the lungs out of the body.  Sepsis or septic shock. This is a serious bodily reaction to an infection.  Blood clotting problems.  Secondary infections due to bacteria or fungus.  Organ failure. This is when your body's organs stop working. The virus that causes COVID-19 is contagious. This means that it can spread from person to person through droplets from coughs and sneezes (respiratory secretions). What are the causes? This illness is caused by a virus. You may catch the virus by:  Breathing in droplets from an infected person. Droplets can be spread by a person breathing, speaking, singing, coughing, or sneezing.  Touching something, like a table or a doorknob, that was exposed to the virus (contaminated) and then touching your mouth, nose, or eyes. What increases the risk? Risk for infection You are more likely to be infected with this virus if you:  Are within 6 feet (2 meters) of a person with COVID-19.  Provide care for or live with a person who is infected with COVID-19.  Spend time in crowded indoor spaces or  live in shared housing. Risk for serious illness You are more likely to become seriously ill from the virus if you:  Are 11 years of age or older. The higher your age, the more you are at risk for serious illness.  Live in a nursing home or long-term care facility.  Have cancer.  Have a long-term (chronic) disease such as: ? Chronic lung  disease, including chronic obstructive pulmonary disease or asthma. ? A long-term disease that lowers your body's ability to fight infection (immunocompromised). ? Heart disease, including heart failure, a condition in which the arteries that lead to the heart become narrow or blocked (coronary artery disease), a disease which makes the heart muscle thick, weak, or stiff (cardiomyopathy). ? Diabetes. ? Chronic kidney disease. ? Sickle cell disease, a condition in which red blood cells have an abnormal "sickle" shape. ? Liver disease.  Are obese. What are the signs or symptoms? Symptoms of this condition can range from mild to severe. Symptoms may appear any time from 2 to 14 days after being exposed to the virus. They include:  A fever or chills.  A cough.  Difficulty breathing.  Headaches, body aches, or muscle aches.  Runny or stuffy (congested) nose.  A sore throat.  New loss of taste or smell. Some people may also have stomach problems, such as nausea, vomiting, or diarrhea. Other people may not have any symptoms of COVID-19. How is this diagnosed? This condition may be diagnosed based on:  Your signs and symptoms, especially if: ? You live in an area with a COVID-19 outbreak. ? You recently traveled to or from an area where the virus is common. ? You provide care for or live with a person who was diagnosed with COVID-19. ? You were exposed to a person who was diagnosed with COVID-19.  A physical exam.  Lab tests, which may include: ? Taking a sample of fluid from the back of your nose and throat (nasopharyngeal fluid), your nose, or your throat using a swab. ? A sample of mucus from your lungs (sputum). ? Blood tests.  Imaging tests, which may include, X-rays, CT scan, or ultrasound. How is this treated? At present, there is no medicine to treat COVID-19. Medicines that treat other diseases are being used on a trial basis to see if they are effective against  COVID-19. Your health care provider will talk with you about ways to treat your symptoms. For most people, the infection is mild and can be managed at home with rest, fluids, and over-the-counter medicines. Treatment for a serious infection usually takes places in a hospital intensive care unit (ICU). It may include one or more of the following treatments. These treatments are given until your symptoms improve.  Receiving fluids and medicines through an IV.  Supplemental oxygen. Extra oxygen is given through a tube in the nose, a face mask, or a hood.  Positioning you to lie on your stomach (prone position). This makes it easier for oxygen to get into the lungs.  Continuous positive airway pressure (CPAP) or bi-level positive airway pressure (BPAP) machine. This treatment uses mild air pressure to keep the airways open. A tube that is connected to a motor delivers oxygen to the body.  Ventilator. This treatment moves air into and out of the lungs by using a tube that is placed in your windpipe.  Tracheostomy. This is a procedure to create a hole in the neck so that a breathing tube can be inserted.  Extracorporeal membrane  oxygenation (ECMO). This procedure gives the lungs a chance to recover by taking over the functions of the heart and lungs. It supplies oxygen to the body and removes carbon dioxide. Follow these instructions at home: Lifestyle  If you are sick, stay home except to get medical care. Your health care provider will tell you how long to stay home. Call your health care provider before you go for medical care.  Rest at home as told by your health care provider.  Do not use any products that contain nicotine or tobacco, such as cigarettes, e-cigarettes, and chewing tobacco. If you need help quitting, ask your health care provider.  Return to your normal activities as told by your health care provider. Ask your health care provider what activities are safe for you. General  instructions  Take over-the-counter and prescription medicines only as told by your health care provider.  Drink enough fluid to keep your urine pale yellow.  Keep all follow-up visits as told by your health care provider. This is important. How is this prevented?  There is no vaccine to help prevent COVID-19 infection. However, there are steps you can take to protect yourself and others from this virus. To protect yourself:   Do not travel to areas where COVID-19 is a risk. The areas where COVID-19 is reported change often. To identify high-risk areas and travel restrictions, check the CDC travel website: wwwnc.cdc.gov/travel/notices  If you live in, or must travel to, an area where COVID-19 is a risk, take precautions to avoid infection. ? Stay away from people who are sick. ? Wash your hands often with soap and water for 20 seconds. If soap and water are not available, use an alcohol-based hand sanitizer. ? Avoid touching your mouth, face, eyes, or nose. ? Avoid going out in public, follow guidance from your state and local health authorities. ? If you must go out in public, wear a cloth face covering or face mask. Make sure your mask covers your nose and mouth. ? Avoid crowded indoor spaces. Stay at least 6 feet (2 meters) away from others. ? Disinfect objects and surfaces that are frequently touched every day. This may include:  Counters and tables.  Doorknobs and light switches.  Sinks and faucets.  Electronics, such as phones, remote controls, keyboards, computers, and tablets. To protect others: If you have symptoms of COVID-19, take steps to prevent the virus from spreading to others.  If you think you have a COVID-19 infection, contact your health care provider right away. Tell your health care team that you think you may have a COVID-19 infection.  Stay home. Leave your house only to seek medical care. Do not use public transport.  Do not travel while you are  sick.  Wash your hands often with soap and water for 20 seconds. If soap and water are not available, use alcohol-based hand sanitizer.  Stay away from other members of your household. Let healthy household members care for children and pets, if possible. If you have to care for children or pets, wash your hands often and wear a mask. If possible, stay in your own room, separate from others. Use a different bathroom.  Make sure that all people in your household wash their hands well and often.  Cough or sneeze into a tissue or your sleeve or elbow. Do not cough or sneeze into your hand or into the air.  Wear a cloth face covering or face mask. Make sure your mask covers your nose   and mouth. Where to find more information  Centers for Disease Control and Prevention: PurpleGadgets.be  World Health Organization: https://www.castaneda.info/ Contact a health care provider if:  You live in or have traveled to an area where COVID-19 is a risk and you have symptoms of the infection.  You have had contact with someone who has COVID-19 and you have symptoms of the infection. Get help right away if:  You have trouble breathing.  You have pain or pressure in your chest.  You have confusion.  You have bluish lips and fingernails.  You have difficulty waking from sleep.  You have symptoms that get worse. These symptoms may represent a serious problem that is an emergency. Do not wait to see if the symptoms will go away. Get medical help right away. Call your local emergency services (911 in the [place] Do not drive yourself to the hospital. Let the emergency medical personnel know if you think you have COVID-19. Summary  COVID-19 is a respiratory infection that is caused by a virus. It is also known as coronavirus disease or novel coronavirus. It can cause serious infections, such as pneumonia, acute respiratory distress syndrome, acute respiratory failure,  or sepsis.  The virus that causes COVID-19 is contagious. This means that it can spread from person to person through droplets from breathing, speaking, singing, coughing, or sneezing.  You are more likely to develop a serious illness if you are 56 years of age or older, have a weak immune system, live in a nursing home, or have chronic disease.  There is no medicine to treat COVID-19. Your health care provider will talk with you about ways to treat your symptoms.  Take steps to protect yourself and others from infection. Wash your hands often and disinfect objects and surfaces that are frequently touched every day. Stay away from people who are sick and wear a mask if you are sick. This information is not intended to replace advice given to you by your health care provider. Make sure you discuss any questions you have with your health care provider. Document Revised: 05/04/2019 Document Reviewed: 08/10/2018 Elsevier Patient Education  2020 [place].  COVID-19: How to Protect Yourself and Others Know how it spreads  There is currently no vaccine to prevent coronavirus disease 2019 (COVID-19).  The best way to prevent illness is to avoid being exposed to this virus.  The virus is thought to spread mainly from person-to-person. ? Between people who are in close contact with one another (within about 6 feet). ? Through respiratory droplets produced when an infected person coughs, sneezes or talks. ? These droplets can land in the mouths or noses of people who are nearby or possibly be inhaled into the lungs. ? COVID-19 may be spread by people who are not showing symptoms. Everyone should Clean your hands often  Wash your hands often with soap and water for at least 20 seconds especially after you have been in a public place, or after blowing your nose, coughing, or sneezing.  If soap and water are not readily available, use a hand sanitizer that contains at least 60% alcohol. Cover  all surfaces of your hands and rub them together until they feel dry.  Avoid touching your eyes, nose, and mouth with unwashed hands. Avoid close contact  Limit contact with others as much as possible.  Avoid close contact with people who are sick.  Put distance between yourself and other people. ? Remember that some people without symptoms may be  able to spread virus. ? This is especially important for people who are at higher risk of getting very GainPain.com.cy Cover your mouth and nose with a mask when around others  You could spread COVID-19 to others even if you do not feel sick.  Everyone should wear a mask in public settings and when around people not living in their household, especially when social distancing is difficult to maintain. ? Masks should not be placed on young children under age 61, anyone who has trouble breathing, or is unconscious, incapacitated or otherwise unable to remove the mask without assistance.  The mask is meant to protect other people in case you are infected.  Do NOT use a facemask meant for a Dietitian.  Continue to keep about 6 feet between yourself and others. The mask is not a substitute for social distancing. Cover coughs and sneezes  Always cover your mouth and nose with a tissue when you cough or sneeze or use the inside of your elbow.  Throw used tissues in the trash.  Immediately wash your hands with soap and water for at least 20 seconds. If soap and water are not readily available, clean your hands with a hand sanitizer that contains at least 60% alcohol. Clean and disinfect  Clean AND disinfect frequently touched surfaces daily. This includes tables, doorknobs, light switches, countertops, handles, desks, phones, keyboards, toilets, faucets, and sinks. [email or web address]  If surfaces are  dirty, clean them: Use detergent or soap and water prior to disinfection.  Then, use a household disinfectant. You can see a list of EPA-registered household disinfectants here. [email or web address] 03/21/2019 This information is not intended to replace advice given to you by your health care provider. Make sure you discuss any questions you have with your health care provider. Document Revised: 03/29/2019 Document Reviewed: 01/25/2019 Elsevier Patient Education  [place].  3 Key Steps to Take While Waiting for Your COVID-19 Test Result To help stop the spread of COVID-19, take these 3 key steps NOW while waiting for your test results: 1. Stay home and monitor your health. Stay home and monitor your health to help protect your friends, family, and others from possibly getting COVID-19 from you. Stay home and away from others:  If possible, stay away from others, especially people who are at higher risk for getting very sick from COVID-19, such as older adults and people with other medical conditions.  If you have been in contact with someone with COVID-19, stay home and away from others for 14 days after your last contact with that person.  If you have a fever, cough or other symptoms of COVID-19, stay home and away from others (except to get medical care). Monitor your health:  Watch for fever, cough, shortness of breath, or other symptoms of COVID-19. Remember, symptoms may appear 2-14 days after exposure to COVID-19 and can include: ? Fever or chills ? Cough ? Shortness of breath or difficulty breathing ? Tiredness ? Muscle or body aches ? Headache ? New loss of taste or smell ? Sore throat ? Congestion or runny nose ? Nausea or vomiting ? Diarrhea 2. Think about the people you have recently been around. If you are diagnosed with [id number], a public health worker may call you to check on your health, discuss who you have been around, and ask where you spent time while  you may have been able to spread COVID-19 to others. While you wait for your COVID-19 test result, think about everyone you have  been around recently. This will be important information to give health workers if your test is positive.  Complete the information on the back of this page to help you remember everyone you have been around. 3. Answer the phone call from the health department. If a public health worker calls you, answer the call to help slow the spread of COVID-19 in your community.  Discussions with health department staff are confidential. This means that your personal and medical information will be kept private and only shared with those who may need to know, like your health care provider.  Your name will not be shared with those you came in contact with. The health department will only notify people you were in close contact with (within 6 feet for more than 15 minutes) that they might have been exposed to COVID-19. Think about the people you have recently been around If you test positive and are diagnosed with COVID-19, someone from the health department may call to check-in on your health, discuss who you have been around, and ask where you spent time while you may have been able to spread COVID-19 to others. This form can help you think about people you have recently been around so you will be ready if a public health worker calls you. Things to think about. Have you:  Gone to work or school?  Gotten together with others (eaten out at [employer], gone out for drinks, exercised with others or gone to a gym, had friends or family over to your house, volunteered, gone to a party, pool, or park)?  Gone to a store in person (e.g., grocery store, mall)?  Gone to in-person appointments (e.g., salon, barber, doctor's or dentist's office)?  Ridden in a car with others (e.g., [employer] or [employer]) or took public transportation?  Been inside a church, synagogue, mosque or other places of  worship? Who lives with you?  ______________________________________________________________________  ______________________________________________________________________  ______________________________________________________________________  ______________________________________________________________________ Who have you been around (within 6 feet for more than 15 minutes) in the last 10 days? (You may have more people to list than the space provided. If so, write on the front of this sheet or a separate piece of paper.) Name ______________________________________________  Phone number ____________________________________  Date you last saw them _____________________________  Where you last saw them ________________________________________________ Name ______________________________________________  Phone number ____________________________________  Date you last saw them _____________________________  Where you last saw them ________________________________________________ Name ______________________________________________  Phone number ____________________________________  Date you last saw them _____________________________  Where you last saw them ________________________________________________ Name ______________________________________________  Phone number ____________________________________  Date you last saw them _____________________________  Where you last saw them ________________________________________________ Name ______________________________________________  Phone number ____________________________________  Date you last saw them _____________________________  Where you last saw them ________________________________________________ What have you done in the last 10 days with other people? Activity _____________________________________________  Location _________________________________________  Date  ____________________________________________ Activity _____________________________________________  Location _________________________________________  Date ____________________________________________ Activity _____________________________________________  Location _________________________________________  Date ____________________________________________ Activity _____________________________________________  Location _________________________________________  Date ____________________________________________ Activity _____________________________________________  Location _________________________________________  Date ____________________________________________ [email or web address] 02/12/2019 This information is not intended to replace advice given to you by your health care provider. Make sure you discuss any questions you have with your health care provider. Document Revised: 06/21/2019 Document Reviewed: 06/21/2019 Elsevier Patient Education  [place].   COVID-19 Frequently Asked Questions COVID-19 (coronavirus disease) is an infection that is caused by a large family of viruses. Some viruses cause illness  in people and others cause illness in animals like camels, cats, and bats. In some cases, the viruses that cause illness in animals can spread to humans. Where did the coronavirus come from? In December 2019, [place] told the [facility]) of several cases of lung disease (human respiratory illness). These cases were linked to an open seafood and livestock market in the city of [place]. The link to the seafood and livestock market suggests that the virus may have spread from animals to humans. However, since that first outbreak in December, the virus has also been shown to spread from person to person. What is the name of the disease and the virus? Disease name Early on, this disease was called novel coronavirus. This is because scientists  determined that the disease was caused by a new (novel) respiratory virus. The World Health Organization [facility]) has now named the disease COVID-19, or coronavirus disease. Virus name The virus that causes the disease is called severe acute respiratory syndrome coronavirus 2 (SARS-CoV-2). More information on disease and virus naming World Health Organization [facility]): www.who.int/emergencies/diseases/novel-coronavirus-2019/technical-guidance/naming-the-coronavirus-disease-(covid-2019)-and-the-virus-that-causes-it Who is at risk for complications from coronavirus disease? Some people may be at higher risk for complications from coronavirus disease. This includes older adults and people who have chronic diseases, such as heart disease, diabetes, and lung disease. If you are at higher risk for complications, take these extra precautions:  Stay home as much as possible.  Avoid social gatherings and travel.  Avoid close contact with others. Stay at least 6 ft (2 m) away from others, if possible.  Wash your hands often with soap and water for at least 20 seconds.  Avoid touching your face, mouth, nose, or eyes.  Keep supplies on hand at home, such as food, medicine, and cleaning supplies.  If you must go out in public, wear a cloth face covering or face mask. Make sure your mask covers your nose and mouth. How does coronavirus disease spread? The virus that causes coronavirus disease spreads easily from person to person (is contagious). You may catch the virus by:  Breathing in droplets from an infected person. Droplets can be spread by a person breathing, speaking, singing, coughing, or sneezing.  Touching something, like a table or a doorknob, that was exposed to the virus (contaminated) and then touching your mouth, nose, or eyes. Can I get the virus from touching surfaces or objects? There is still a lot that we do not know about the virus that causes coronavirus disease. Scientists are basing  a lot of information on what they know about similar viruses, such as:  Viruses cannot generally survive on surfaces for long. They need a human body (host) to survive.  It is more likely that the virus is spread by close contact with people who are sick (direct contact), such as through: ? Shaking hands or hugging. ? Breathing in respiratory droplets that travel through the air. Droplets can be spread by a person breathing, speaking, singing, coughing, or sneezing.  It is less likely that the virus is spread when a person touches a surface or object that has the virus on it (indirect contact). The virus may be able to enter the body if the person touches a surface or object and then touches his or her face, eyes, nose, or mouth. Can a person spread the virus without having symptoms of the disease? It may be possible for the virus to spread before a person has symptoms of the disease, but this is most likely  not the main way the virus is spreading. It is more likely for the virus to spread by being in close contact with people who are sick and breathing in the respiratory droplets spread by a person breathing, speaking, singing, coughing, or sneezing. What are the symptoms of coronavirus disease? Symptoms vary from person to person and can range from mild to severe. Symptoms may include:  Fever or chills.  Cough.  Difficulty breathing or feeling short of breath.  Headaches, body aches, or muscle aches.  Runny or stuffy (congested) nose.  Sore throat.  New loss of taste or smell.  Nausea, vomiting, or diarrhea. These symptoms can appear anywhere from 2 to 14 days after you have been exposed to the virus. Some people may not have any symptoms. If you develop symptoms, call your health care provider. People with severe symptoms may need hospital care. Should I be tested for this virus? Your health care provider will decide whether to test you based on your symptoms, history of exposure,  and your risk factors. How does a health care provider test for this virus? Health care providers will collect samples to send for testing. Samples may include:  Taking a swab of fluid from the back of your nose and throat, your nose, or your throat.  Taking fluid from the lungs by having you cough up mucus (sputum) into a sterile cup.  Taking a blood sample. Is there a treatment or vaccine for this virus? Currently, there is no vaccine to prevent coronavirus disease. Also, there are no medicines like antibiotics or antivirals to treat the virus. A person who becomes sick is given supportive care, which means rest and fluids. A person may also relieve his or her symptoms by using over-the-counter medicines that treat sneezing, coughing, and runny nose. These are the same medicines that a person takes for the common cold. If you develop symptoms, call your health care provider. People with severe symptoms may need hospital care. What can I do to protect myself and my family from this virus?     You can protect yourself and your family by taking the same actions that you would take to prevent the spread of other viruses. Take the following actions:  Wash your hands often with soap and water for at least 20 seconds. If soap and water are not available, use alcohol-based hand sanitizer.  Avoid touching your face, mouth, nose, or eyes.  Cough or sneeze into a tissue, sleeve, or elbow. Do not cough or sneeze into your hand or the air. ? If you cough or sneeze into a tissue, throw it away immediately and wash your hands.  Disinfect objects and surfaces that you frequently touch every day.  Stay away from people who are sick.  Avoid going out in public, follow guidance from your state and local health authorities.  Avoid crowded indoor spaces. Stay at least 6 ft (2 m) away from others.  If you must go out in public, wear a cloth face covering or face mask. Make sure your mask covers your  nose and mouth.  Stay home if you are sick, except to get medical care. Call your health care provider before you get medical care. Your health care provider will tell you how long to stay home.  Make sure your vaccines are up to date. Ask your health care provider what vaccines you need. What should I do if I need to travel? Follow travel recommendations from your local health authority, the CDC,  and WHO. Travel information and advice  Centers for Disease Control and Prevention (CDC): BodyEditor.hu  World Health Organization [facility]): ThirdIncome.ca Know the risks and take action to protect your health  You are at higher risk of getting coronavirus disease if you are traveling to areas with an outbreak or if you are exposed to travelers from areas with an outbreak.  Wash your hands often and practice good hygiene to lower the risk of catching or spreading the virus. What should I do if I am sick? General instructions to stop the spread of infection  Wash your hands often with soap and water for at least 20 seconds. If soap and water are not available, use alcohol-based hand sanitizer.  Cough or sneeze into a tissue, sleeve, or elbow. Do not cough or sneeze into your hand or the air.  If you cough or sneeze into a tissue, throw it away immediately and wash your hands.  Stay home unless you must get medical care. Call your health care provider or local health authority before you get medical care.  Avoid public areas. Do not take public transportation, if possible.  If you can, wear a mask if you must go out of the house or if you are in close contact with someone who is not sick. Make sure your mask covers your nose and mouth. Keep your home clean  Disinfect objects and surfaces that are frequently touched every day. This may include: ? Counters and tables. ? Doorknobs and light  switches. ? Sinks and faucets. ? Electronics such as phones, remote controls, keyboards, computers, and tablets.  Wash dishes in hot, soapy water or use a dishwasher. Air-dry your dishes.  Wash laundry in hot water. Prevent infecting other household members  Let healthy household members care for children and pets, if possible. If you have to care for children or pets, wash your hands often and wear a mask.  Sleep in a different bedroom or bed, if possible.  Do not share personal items, such as razors, toothbrushes, deodorant, combs, brushes, towels, and washcloths. Where to find more information Centers for Disease Control and Prevention (CDC)  Information and news updates: https://www.butler-gonzalez.com/ World Health Organization [facility])  Information and news updates: MissExecutive.com.ee  Coronavirus health topic: https://www.castaneda.info/  Questions and answers on COVID-19: OpportunityDebt.at  Global tracker: who.sprinklr.com American Academy of Pediatrics (AAP)  Information for families: www.healthychildren.org/English/health-issues/conditions/chest-lungs/Pages/2019-Novel-Coronavirus.aspx The coronavirus situation is changing rapidly. Check your local health authority website or the CDC and [facility] websites for updates and news. When should I contact a health care provider?  Contact your health care provider if you have symptoms of an infection, such as fever or cough, and you: ? Have been near anyone who is known to have coronavirus disease. ? Have come into contact with a person who is suspected to have coronavirus disease. ? Have traveled to an area where there is an outbreak of COVID-19. When should I get emergency medical care?  Get help right away by calling your local emergency services (911 in the [place] if you have: ? Trouble breathing. ? Pain or pressure in your  chest. ? Confusion. ? Blue-tinged lips and fingernails. ? Difficulty waking from sleep. ? Symptoms that get worse. Let the emergency medical personnel know if you think you have coronavirus disease. Summary  A new respiratory virus is spreading from person to person and causing COVID-19 (coronavirus disease).  The virus that causes COVID-19 appears to spread easily. It spreads from one person to another through droplets  from breathing, speaking, singing, coughing, or sneezing.  Older adults and those with chronic diseases are at higher risk of disease. If you are at higher risk for complications, take extra precautions.  There is currently no vaccine to prevent coronavirus disease. There are no medicines, such as antibiotics or antivirals, to treat the virus.  You can protect yourself and your family by washing your hands often, avoiding touching your face, and covering your coughs and sneezes. This information is not intended to replace advice given to you by your health care provider. Make sure you discuss any questions you have with your health care provider. Document Revised: 05/04/2019 Document Reviewed: 10/31/2018 Elsevier Patient Education  2020 Elsevier Inc.  COVID-19: Quarantine vs. Isolation QUARANTINE keeps someone who was in close contact with someone who has COVID-19 away from others. If you had close contact with a person who has COVID-19  Stay home until 14 days after your last contact.  Check your temperature twice a day and watch for symptoms of COVID-19.  If possible, stay away from people who are at higher-risk for getting very sick from COVID-19. ISOLATION keeps someone who is sick or tested positive for COVID-19 without symptoms away from others, even in their own home. If you are sick and think or know you have COVID-19  Stay home until after ? At least 10 days since symptoms first appeared and ? At least 24 hours with no fever without fever-reducing  medication and ? Symptoms have improved If you tested positive for COVID-19 but do not have symptoms  Stay home until after ? 10 days have passed since your positive test If you live with others, stay in a specific "sick room" or area and away from other people or animals, including pets. Use a separate bathroom, if available. SouthAmericaFlowers.co.uk 02/05/2019 This information is not intended to replace advice given to you by your health care provider. Make sure you discuss any questions you have with your health care provider. Document Revised: 06/21/2019 Document Reviewed: 06/21/2019 Elsevier Patient Education  2020 [employer]. COVID-19 Frequently Asked Questions COVID-19 (coronavirus disease) is an infection that is caused by a large family of viruses. Some viruses cause illness in people and others cause illness in animals like camels, cats, and bats. In some cases, the viruses that cause illness in animals can spread to humans. Where did the coronavirus come from? In December 2019, [place] told the [employer] [facility]) of several cases of lung disease (human respiratory illness). These cases were linked to an open seafood and livestock market in the city of [place]. The link to the seafood and livestock market suggests that the virus may have spread from animals to humans. However, since that first outbreak in December, the virus has also been shown to spread from person to person. What is the name of the disease and the virus? Disease name Early on, this disease was called novel coronavirus. This is because scientists determined that the disease was caused by a new (novel) respiratory virus. The World Health Organization [facility]) has now named the disease COVID-19, or coronavirus disease. Virus name The virus that causes the disease is called severe acute respiratory syndrome coronavirus 2 (SARS-CoV-2). More information on disease and virus naming World Health Organization [facility]):  www.who.int/emergencies/diseases/novel-coronavirus-2019/technical-guidance/naming-the-coronavirus-disease-(covid-2019)-and-the-virus-that-causes-it Who is at risk for complications from coronavirus disease? Some people may be at higher risk for complications from coronavirus disease. This includes older adults and people who have chronic diseases, such as heart disease, diabetes, and lung  disease. If you are at higher risk for complications, take these extra precautions:  Stay home as much as possible.  Avoid social gatherings and travel.  Avoid close contact with others. Stay at least 6 ft (2 m) away from others, if possible.  Wash your hands often with soap and water for at least 20 seconds.  Avoid touching your face, mouth, nose, or eyes.  Keep supplies on hand at home, such as food, medicine, and cleaning supplies.  If you must go out in public, wear a cloth face covering or face mask. Make sure your mask covers your nose and mouth. How does coronavirus disease spread? The virus that causes coronavirus disease spreads easily from person to person (is contagious). You may catch the virus by:  Breathing in droplets from an infected person. Droplets can be spread by a person breathing, speaking, singing, coughing, or sneezing.  Touching something, like a table or a doorknob, that was exposed to the virus (contaminated) and then touching your mouth, nose, or eyes. Can I get the virus from touching surfaces or objects? There is still a lot that we do not know about the virus that causes coronavirus disease. Scientists are basing a lot of information on what they know about similar viruses, such as:  Viruses cannot generally survive on surfaces for long. They need a human body (host) to survive.  It is more likely that the virus is spread by close contact with people who are sick (direct contact), such as through: ? Shaking hands or hugging. ? Breathing in respiratory droplets that  travel through the air. Droplets can be spread by a person breathing, speaking, singing, coughing, or sneezing.  It is less likely that the virus is spread when a person touches a surface or object that has the virus on it (indirect contact). The virus may be able to enter the body if the person touches a surface or object and then touches his or her face, eyes, nose, or mouth. Can a person spread the virus without having symptoms of the disease? It may be possible for the virus to spread before a person has symptoms of the disease, but this is most likely not the main way the virus is spreading. It is more likely for the virus to spread by being in close contact with people who are sick and breathing in the respiratory droplets spread by a person breathing, speaking, singing, coughing, or sneezing. What are the symptoms of coronavirus disease? Symptoms vary from person to person and can range from mild to severe. Symptoms may include:  Fever or chills.  Cough.  Difficulty breathing or feeling short of breath.  Headaches, body aches, or muscle aches.  Runny or stuffy (congested) nose.  Sore throat.  New loss of taste or smell.  Nausea, vomiting, or diarrhea. These symptoms can appear anywhere from 2 to 14 days after you have been exposed to the virus. Some people may not have any symptoms. If you develop symptoms, call your health care provider. People with severe symptoms may need hospital care. Should I be tested for this virus? Your health care provider will decide whether to test you based on your symptoms, history of exposure, and your risk factors. How does a health care provider test for this virus? Health care providers will collect samples to send for testing. Samples may include:  Taking a swab of fluid from the back of your nose and throat, your nose, or your throat.  Taking fluid from  the lungs by having you cough up mucus (sputum) into a sterile cup.  Taking a blood  sample. Is there a treatment or vaccine for this virus? Currently, there is no vaccine to prevent coronavirus disease. Also, there are no medicines like antibiotics or antivirals to treat the virus. A person who becomes sick is given supportive care, which means rest and fluids. A person may also relieve his or her symptoms by using over-the-counter medicines that treat sneezing, coughing, and runny nose. These are the same medicines that a person takes for the common cold. If you develop symptoms, call your health care provider. People with severe symptoms may need hospital care. What can I do to protect myself and my family from this virus?     You can protect yourself and your family by taking the same actions that you would take to prevent the spread of other viruses. Take the following actions:  Wash your hands often with soap and water for at least 20 seconds. If soap and water are not available, use alcohol-based hand sanitizer.  Avoid touching your face, mouth, nose, or eyes.  Cough or sneeze into a tissue, sleeve, or elbow. Do not cough or sneeze into your hand or the air. ? If you cough or sneeze into a tissue, throw it away immediately and wash your hands.  Disinfect objects and surfaces that you frequently touch every day.  Stay away from people who are sick.  Avoid going out in public, follow guidance from your state and local health authorities.  Avoid crowded indoor spaces. Stay at least 6 ft (2 m) away from others.  If you must go out in public, wear a cloth face covering or face mask. Make sure your mask covers your nose and mouth.  Stay home if you are sick, except to get medical care. Call your health care provider before you get medical care. Your health care provider will tell you how long to stay home.  Make sure your vaccines are up to date. Ask your health care provider what vaccines you need. What should I do if I need to travel? Follow travel recommendations  from your local health authority, the CDC, and WHO. Travel information and advice  Centers for Disease Control and Prevention (CDC): GeminiCard.gl  World Health Organization [facility]): PreviewDomains.se Know the risks and take action to protect your health  You are at higher risk of getting coronavirus disease if you are traveling to areas with an outbreak or if you are exposed to travelers from areas with an outbreak.  Wash your hands often and practice good hygiene to lower the risk of catching or spreading the virus. What should I do if I am sick? General instructions to stop the spread of infection  Wash your hands often with soap and water for at least 20 seconds. If soap and water are not available, use alcohol-based hand sanitizer.  Cough or sneeze into a tissue, sleeve, or elbow. Do not cough or sneeze into your hand or the air.  If you cough or sneeze into a tissue, throw it away immediately and wash your hands.  Stay home unless you must get medical care. Call your health care provider or local health authority before you get medical care.  Avoid public areas. Do not take public transportation, if possible.  If you can, wear a mask if you must go out of the house or if you are in close contact with someone who is not sick. Make sure  your mask covers your nose and mouth. Keep your home clean  Disinfect objects and surfaces that are frequently touched every day. This may include: ? Counters and tables. ? Doorknobs and light switches. ? Sinks and faucets. ? Electronics such as phones, remote controls, keyboards, computers, and tablets.  Wash dishes in hot, soapy water or use a dishwasher. Air-dry your dishes.  Wash laundry in hot water. Prevent infecting other household members  Let healthy household members care for children and pets, if possible. If you have to care for children or  pets, wash your hands often and wear a mask.  Sleep in a different bedroom or bed, if possible.  Do not share personal items, such as razors, toothbrushes, deodorant, combs, brushes, towels, and washcloths. Where to find more information Centers for Disease Control and Prevention (CDC)  Information and news updates: CardRetirement.cz World Health Organization [facility])  Information and news updates: AffordableSalon.es  Coronavirus health topic: https://thompson-craig.com/  Questions and answers on COVID-19: kruiseway.com  Global tracker: who.sprinklr.com American Academy of Pediatrics (AAP)  Information for families: www.healthychildren.org/English/health-issues/conditions/chest-lungs/Pages/2019-Novel-Coronavirus.aspx The coronavirus situation is changing rapidly. Check your local health authority website or the CDC and [facility] websites for updates and news. When should I contact a health care provider?  Contact your health care provider if you have symptoms of an infection, such as fever or cough, and you: ? Have been near anyone who is known to have coronavirus disease. ? Have come into contact with a person who is suspected to have coronavirus disease. ? Have traveled to an area where there is an outbreak of COVID-19. When should I get emergency medical care?  Get help right away by calling your local emergency services (911 in the [place] if you have: ? Trouble breathing. ? Pain or pressure in your chest. ? Confusion. ? Blue-tinged lips and fingernails. ? Difficulty waking from sleep. ? Symptoms that get worse. Let the emergency medical personnel know if you think you have coronavirus disease. Summary  A new respiratory virus is spreading from person to person and causing COVID-19 (coronavirus disease).  The virus that causes COVID-19 appears to spread easily. It spreads from one  person to another through droplets from breathing, speaking, singing, coughing, or sneezing.  Older adults and those with chronic diseases are at higher risk of disease. If you are at higher risk for complications, take extra precautions.  There is currently no vaccine to prevent coronavirus disease. There are no medicines, such as antibiotics or antivirals, to treat the virus.  You can protect yourself and your family by washing your hands often, avoiding touching your face, and covering your coughs and sneezes. This information is not intended to replace advice given to you by your health care provider. Make sure you discuss any questions you have with your health care provider. Document Revised: 05/04/2019 Document Reviewed: 10/31/2018 Elsevier Patient Education  2020 Elsevier Inc.    Person Under Monitoring Name: Zoe Fox  Location: [address] [place] [address]   Infection Prevention Recommendations for Individuals Confirmed to have, or Being Evaluated for, 2019 Novel Coronavirus (COVID-19) Infection Who Receive Care at Home  Individuals who are confirmed to have, or are being evaluated for, COVID-19 should follow the prevention steps below until a healthcare provider or local or state health department says they can return to normal activities.  Stay home except to get medical care You should restrict activities outside your home, except for getting medical care. Do not go to work, school, or  public areas, and do not use public transportation or taxis.  Call ahead before visiting your doctor Before your medical appointment, call the healthcare provider and tell them that you have, or are being evaluated for, COVID-19 infection. This will help the healthcare provider's office take steps to keep other people from getting infected. Ask your healthcare provider to call the local or state health department.  Monitor your symptoms Seek prompt medical attention if your  illness is worsening (e.g., difficulty breathing). Before going to your medical appointment, call the healthcare provider and tell them that you have, or are being evaluated for, COVID-19 infection. Ask your healthcare provider to call the local or state health department.  Wear a facemask You should wear a facemask that covers your nose and mouth when you are in the same room with other people and when you visit a healthcare provider. People who live with or visit you should also wear a facemask while they are in the same room with you.  Separate yourself from other people in your home As much as possible, you should stay in a different room from other people in your home. Also, you should use a separate bathroom, if available.  Avoid sharing household items You should not share dishes, drinking glasses, cups, eating utensils, towels, bedding, or other items with other people in your home. After using these items, you should wash them thoroughly with soap and water.  Cover your coughs and sneezes Cover your mouth and nose with a tissue when you cough or sneeze, or you can cough or sneeze into your sleeve. Throw used tissues in a lined trash can, and immediately wash your hands with soap and water for at least 20 seconds or use an alcohol-based hand rub.  Wash your [employer] your hands often and thoroughly with soap and water for at least 20 seconds. You can use an alcohol-based hand sanitizer if soap and water are not available and if your hands are not visibly dirty. Avoid touching your eyes, nose, and mouth with unwashed hands.   Prevention Steps for Caregivers and Household Members of Individuals Confirmed to have, or Being Evaluated for, COVID-19 Infection Being Cared for in the Home  If you live with, or provide care at home for, a person confirmed to have, or being evaluated for, COVID-19 infection please follow these guidelines to prevent infection:  Follow healthcare  provider's instructions Make sure that you understand and can help the patient follow any healthcare provider instructions for all care.  Provide for the patient's basic needs You should help the patient with basic needs in the home and provide support for getting groceries, prescriptions, and other personal needs.  Monitor the patient's symptoms If they are getting sicker, call his or her medical provider and tell them that the patient has, or is being evaluated for, COVID-19 infection. This will help the healthcare provider's office take steps to keep other people from getting infected. Ask the healthcare provider to call the local or state health department.  Limit the number of people who have contact with the patient  If possible, have only one caregiver for the patient.  Other household members should stay in another home or place of residence. If this is not possible, they should stay  in another room, or be separated from the patient as much as possible. Use a separate bathroom, if available.  Restrict visitors who do not have an essential need to be in the home.  Keep older adults,  very young children, and other sick people away from the patient Keep older adults, very young children, and those who have compromised immune systems or chronic health conditions away from the patient. This includes people with chronic heart, lung, or kidney conditions, diabetes, and cancer.  Ensure good ventilation Make sure that shared spaces in the home have good air flow, such as from an air conditioner or an opened window, weather permitting.  Wash your hands often  Wash your hands often and thoroughly with soap and water for at least 20 seconds. You can use an alcohol based hand sanitizer if soap and water are not available and if your hands are not visibly dirty.  Avoid touching your eyes, nose, and mouth with unwashed hands.  Use disposable paper towels to dry your hands. If not  available, use dedicated cloth towels and replace them when they become wet.  Wear a facemask and gloves  Wear a disposable facemask at all times in the room and gloves when you touch or have contact with the patient's blood, body fluids, and/or secretions or excretions, such as sweat, saliva, sputum, nasal mucus, vomit, urine, or feces.  Ensure the mask fits over your nose and mouth tightly, and do not touch it during use.  Throw out disposable facemasks and gloves after using them. Do not reuse.  Wash your hands immediately after removing your facemask and gloves.  If your personal clothing becomes contaminated, carefully remove clothing and launder. Wash your hands after handling contaminated clothing.  Place all used disposable facemasks, gloves, and other waste in a lined container before disposing them with other household waste.  Remove gloves and wash your hands immediately after handling these items.  Do not share dishes, glasses, or other household items with the patient  Avoid sharing household items. You should not share dishes, drinking glasses, cups, eating utensils, towels, bedding, or other items with a patient who is confirmed to have, or being evaluated for, COVID-19 infection.  After the person uses these items, you should wash them thoroughly with soap and water.  Wash laundry thoroughly  Immediately remove and wash clothes or bedding that have blood, body fluids, and/or secretions or excretions, such as sweat, saliva, sputum, nasal mucus, vomit, urine, or feces, on them.  Wear gloves when handling laundry from the patient.  Read and follow directions on labels of laundry or clothing items and detergent. In general, wash and dry with the warmest temperatures recommended on the label.  Clean all areas the individual has used often  Clean all touchable surfaces, such as counters, tabletops, doorknobs, bathroom fixtures, toilets, phones, keyboards, tablets, and  bedside tables, every day. Also, clean any surfaces that may have blood, body fluids, and/or secretions or excretions on them.  Wear gloves when cleaning surfaces the patient has come in contact with.  Use a diluted bleach solution (e.g., dilute bleach with 1 part bleach and 10 parts water) or a household disinfectant with a label that says EPA-registered for coronaviruses. To make a bleach solution at home, add 1 tablespoon of bleach to 1 quart (4 cups) of water. For a larger supply, add  cup of bleach to 1 gallon (16 cups) of water.  Read labels of cleaning products and follow recommendations provided on product labels. Labels contain instructions for safe and effective use of the cleaning product including precautions you should take when applying the product, such as wearing gloves or eye protection and making sure you have good ventilation during use of  the product.  Remove gloves and wash hands immediately after cleaning.  Monitor yourself for signs and symptoms of illness Caregivers and household members are considered close contacts, should monitor their health, and will be asked to limit movement outside of the home to the extent possible. Follow the monitoring steps for close contacts listed on the symptom monitoring form.   ? If you have additional questions, contact your local health department or call the epidemiologist on call at [phone] (available 24/7). ? This guidance is subject to change. For the most up-to-date guidance from [facility], please refer to their website: [email or web address]

## 2020-02-06 NOTE — Discharge Summary (Signed)
Physician Discharge Summary  Zoe Fox WJX:[id number] DOB: 06/25/1996 DOA: 02/03/2020  PCP: Patient, No Pcp Per  Admit date: 02/03/2020 Discharge date: 02/06/2020  Admitted From: Home Disposition: Home   Recommendations for Outpatient Follow-up:  1. Follow up with PCP in 1-2 weeks to reestablish care and continue management for T1DM with HbA1c >15.5%.  2. Note isolation period for covid-19 infection is 10 days from date of positive testing. Received regeneron on day of discharge 7/21.   Home Health: None Equipment/Devices: None Discharge Condition: Stable CODE STATUS: Full Diet recommendation: Carb-modified  Brief/Interim Summary: Zoe Fox is a 24 y.o. female with a history of T1DM, SAB Dec 2020 who presented to the ED 7/18 found to be in DKA and screened positive for covid-19. Blood sugars improved, anion gap acidosis resolved with treatment. Regeneron was given on 7/21 prior to discharge with new prescription for novolog.   Discharge Diagnoses:  Principal Problem:   DKA (diabetic ketoacidoses) (HCC) Active Problems:   Type I diabetes mellitus (HCC)   Metabolic acidosis   Tachycardia  Diabetic ketoacidosis in poorly-controlled T1DM: HbA1c >15.5% indicating average blood sugar of ~400mg /dl.  - Diabetic education provided throughout hospitalization.  - DKA has resolved, blood sugars within acceptable range on nearly home regimen suggesting incomplete adherence. New prescription for novolog sent. Patient is reliant on her mother for assistance with medications. Will establish care with endocrinology ASAP.   Mild-moderate covid-19 infection: SARS-CoV-2 PCR positive on 7/18, confirmed cycle thresholds of 17.9 (N2) and 16.2 (E) consistent with early infection. Has no CXR infiltrates, no hypoxemia (100% on room air). Doubt this is primary precipitant of DKA based on elevated A1c. Inflammatory marker slightly elevated but trending downward (2.8 > 2) without intervention.   This  patient is a 24 y.o. female that meets the FDA criteria for Emergency Use Authorization of COVID monoclonal antibody casirivimab/imdevimab.  Has a (+) direct SARS-CoV-2 viral test result  Has mild or moderate COVID-19   Is NOT hospitalized due to COVID-19 (Hospitalized due to DKA)  Is within 10 days of symptom onset  Has at least one of the high risk factor(s) for progression to severe COVID-19 and/or hospitalization as defined in EUA.  Specific high risk criteria : Diabetes I have spoken and communicated the following to the patient or parent/caregiver regarding COVID monoclonal antibody treatment:  1. FDA has authorized the emergency use for the treatment of mild to moderate COVID-19 in adults and pediatric patients with positive results of direct SARS-CoV-2 viral testing who are 23 years of age and older weighing at least 40 kg, and who are at high risk for progressing to severe COVID-19 and/or hospitalization.  2. The significant known and potential risks and benefits of COVID monoclonal antibody, and the extent to which such potential risks and benefits are unknown.  3. Information on available alternative treatments and the risks and benefits of those alternatives, including clinical trials.  4. Patients treated with COVID monoclonal antibody should continue to self-isolate and use infection control measures (e.g., wear mask, isolate, social distance, avoid sharing personal items, clean and disinfect "high touch" surfaces, and frequent handwashing) according to CDC guidelines.   5. The patient or parent/caregiver has the option to accept or refuse COVID monoclonal antibody treatment.  After reviewing this information with the patient and per her request, her mother, The patient agreed to proceed with receiving casirivimab\imdevimab infusion and will be provided a copy of the Fact sheet prior to receiving the infusion.  Discharge Instructions Discharge Instructions  Temperature  monitoring   Complete by: Feb 04, 2020    After how many days would you like to receive a notification of this patient's flowsheet entries?: 1   MyChart COVID-19 home monitoring program   Complete by: Feb 05, 2020    Is the patient willing to use the MyChart Mobile App for home monitoring?: Yes   Care order/instruction   Complete by: As directed    You were admitted for DKA, a life-threatening complication of poorly controlled diabetes. To avoid the chance of death, you will need to continue taking insulin and follow up with a primary care provider and endocrinologist as soon as possible. You will need to complete 10 total days of isolation due to being covid-positive, but can return to normal precautions after that. You were given a monoclonal antibody to help your body fight covid-19 and reduce your risk of getting worse. If you DO notice shortness of breath or your oxygen saturations go below 90% consistently, you must return for care.   Diet - low sodium heart healthy   Complete by: As directed    Discharge instructions   Complete by: As directed    Please ensure that you take your insulins regularly You had pretty mild coronavirus and you do not need antibiotics Should you present with very elevated temperatures, shortness of breath or significant difficulty breathing we may need to consider you getting further care in the hospital   Increase activity slowly   Complete by: As directed      Allergies as of 02/06/2020   No Known Allergies     Medication List    STOP taking these medications   insulin aspart 100 UNIT/ML injection Commonly known as: novoLOG Replaced by: NovoLOG FlexPen 100 UNIT/ML FlexPen     TAKE these medications   acetaminophen 325 MG tablet Commonly known as: TYLENOL Take 650 mg by mouth every 6 (six) hours as needed for mild pain or headache.   ascorbic acid 500 MG tablet Commonly known as: VITAMIN C Take 1 tablet (500 mg total) by mouth daily.    enalapril 10 MG tablet Commonly known as: VASOTEC Take 10 mg by mouth daily.   fluticasone 50 MCG/ACT nasal spray Commonly known as: FLONASE Place 2 sprays into both nostrils daily for 10 days.   insulin glargine 100 UNIT/ML injection Commonly known as: LANTUS Inject 0.3 mLs (30 Units total) into the skin daily.   nitrofurantoin (macrocrystal-monohydrate) 100 MG capsule Commonly known as: MACROBID Take 100 mg by mouth at bedtime. 10 day supply   NovoLOG FlexPen 100 UNIT/ML FlexPen Generic drug: insulin aspart Inject 5-15 Units into the skin 3 (three) times daily with meals. Replaces: insulin aspart 100 UNIT/ML injection       No Known Allergies  Consultations:  Diabetes coordinator  Procedures/Studies: DG CHEST PORT 1 VIEW  Result Date: 02/03/2020 CLINICAL DATA:  Nausea, vomiting. EXAM: PORTABLE CHEST 1 VIEW COMPARISON:  None. FINDINGS: The heart size and mediastinal contours are within normal limits. Both lungs are clear. The visualized skeletal structures are unremarkable. IMPRESSION: No active disease. Electronically Signed   By: Lupita Raider M.D.   On: 02/03/2020 14:30     Subjective: Feels malaise, fatigue, some nausea when eating, but ate most of breakfast without vomiting. No chest pain, getting up to bathroom unassisted.  Discharge Exam: Vitals:   02/05/20 2116 02/06/20 0554  BP: 120/73 107/62  Pulse: 92 87  Resp:  16  Temp: 98.1 F (36.7 C) 99  F (37.2 C)  SpO2: 100% 95%   General: Pt is alert, awake, not in acute distress Cardiovascular: RRR, S1/S2 +, no rubs, no gallops Respiratory: CTA bilaterally, no wheezing, no rhonchi Abdominal: Soft, NT, ND, bowel sounds + Extremities: No edema, no cyanosis  Labs: BNP (last 3 results) No results for input(s): BNP in the last 8760 hours. Basic Metabolic Panel: Recent Labs  Lab 02/03/20 1854 02/03/20 2357 02/04/20 0316 02/05/20 0418 02/06/20 0936  NA 140 139 138 136 140  K 4.0 3.1* 3.2* 2.8*  3.4*  CL 108 107 108 104 108  CO2 11* 16* 19* 21* 24  GLUCOSE 190* 206* 191* 352* 157*  BUN 10 9 8 8 13   CREATININE 0.71 0.52 0.55 0.59 0.55  CALCIUM 9.0 8.9 8.8* 8.5* 8.5*  MG  --   --   --  1.9  --    Liver Function Tests: Recent Labs  Lab 02/04/20 0316 02/05/20 0418  AST 49* 35  ALT 48* 37  ALKPHOS 80 82  BILITOT 0.3 0.5  PROT 6.8 6.3*  ALBUMIN 3.5 3.1*   No results for input(s): LIPASE, AMYLASE in the last 168 hours. No results for input(s): AMMONIA in the last 168 hours. CBC: Recent Labs  Lab 02/03/20 0812 02/03/20 1026 02/04/20 0316  WBC 6.3 7.6 7.6  HGB 14.8 13.0 13.4  HCT 49.8* 44.0 42.2  MCV 85.6 85.3 80.8  PLT 322 274 250   Cardiac Enzymes: No results for input(s): CKTOTAL, CKMB, CKMBINDEX, TROPONINI in the last 168 hours. BNP: Invalid input(s): POCBNP CBG: Recent Labs  Lab 02/05/20 1158 02/05/20 1643 02/05/20 2247 02/06/20 0434 02/06/20 0903  GLUCAP 345* 328* 365* 337* 163*   D-Dimer Recent Labs    02/05/20 0418 02/06/20 0420  DDIMER 1.33* 1.37*   Hgb A1c No results for input(s): HGBA1C in the last 72 hours. Lipid Profile No results for input(s): CHOL, HDL, LDLCALC, TRIG, CHOLHDL, LDLDIRECT in the last 72 hours. Thyroid function studies No results for input(s): TSH, T4TOTAL, T3FREE, THYROIDAB in the last 72 hours.  Invalid input(s): FREET3 Anemia work up Recent Labs    02/05/20 0418 02/06/20 0420  FERRITIN 104 121   Urinalysis    Component Value Date/Time   COLORURINE STRAW (A) 02/03/2020 0807   APPEARANCEUR CLEAR 02/03/2020 0807   LABSPEC 1.024 02/03/2020 0807   PHURINE 5.0 02/03/2020 0807   GLUCOSEU >=500 (A) 02/03/2020 0807   HGBUR MODERATE (A) 02/03/2020 0807   BILIRUBINUR NEGATIVE 02/03/2020 0807   KETONESUR 80 (A) 02/03/2020 0807   PROTEINUR 30 (A) 02/03/2020 0807   NITRITE NEGATIVE 02/03/2020 0807   LEUKOCYTESUR NEGATIVE 02/03/2020 0807    Microbiology Recent Results (from the past 240 hour(s))  SARS  Coronavirus 2 by RT PCR (hospital order, performed in [facility] hospital lab) Nasopharyngeal Nasopharyngeal Swab     Status: Abnormal   Collection Time: 02/03/20 10:21 AM   Specimen: Nasopharyngeal Swab  Result Value Ref Range Status   SARS Coronavirus 2 POSITIVE (A) NEGATIVE Final    Comment: RESULT CALLED TO, READ BACK BY AND VERIFIED WITH: JOSEPH,M RN @1305  ON 02/03/20 JACKSON,K (NOTE) SARS-CoV-2 target nucleic acids are DETECTED  SARS-CoV-2 RNA is generally detectable in upper respiratory specimens  during the acute phase of infection.  Positive results are indicative  of the presence of the identified virus, but do not rule out bacterial infection or co-infection with other pathogens not detected by the test.  Clinical correlation with patient history and  other diagnostic information is  necessary to determine patient infection status.  The expected result is negative.  Fact Sheet for Patients:   BoilerBrush.com.cy   Fact Sheet for Healthcare Providers:   https://pope.com/    This test is not yet approved or cleared by the [place] FDA and  has been authorized for detection and/or diagnosis of SARS-CoV-2 by FDA under an Emergency Use Authorization (EUA).  This EUA will remain in effect (meaning thi s test can be used) for the duration of  the COVID-19 declaration under Section 564(b)(1) of the Act, 21 U.S.C. section 360-bbb-3(b)(1), unless the authorization is terminated or revoked sooner.  Performed at [facility], [address], [place], [place] [address]   MRSA PCR Screening     Status: None   Collection Time: 02/03/20  2:23 PM   Specimen: Nasal Mucosa; Nasopharyngeal  Result Value Ref Range Status   MRSA by PCR NEGATIVE NEGATIVE Final    Comment:        The GeneXpert MRSA Assay (FDA approved for NASAL specimens only), is one component of a comprehensive MRSA colonization surveillance  program. It is not intended to diagnose MRSA infection nor to guide or monitor treatment for MRSA infections. Performed at [facility], [address] [place], [place] [address]     Time coordinating discharge: Approximately 40 minutes  Tyrone Nine, MD  [facility] 02/06/2020, 12:11 PM

## 2020-02-09 ENCOUNTER — Inpatient Hospital Stay (HOSPITAL_COMMUNITY)
Admission: EM | Admit: 2020-02-09 | Discharge: 2020-02-13 | Disposition: A | Discharge status: Home / Self Care | Payer: BC Managed Care – PPO | Source: Home / Self Care | Attending: Family Medicine | Admitting: Family Medicine

## 2020-02-09 ENCOUNTER — Other Ambulatory Visit: Payer: Self-pay

## 2020-02-09 ENCOUNTER — Encounter (HOSPITAL_COMMUNITY): Payer: Self-pay | Admitting: Emergency Medicine

## 2020-02-09 DIAGNOSIS — E875 Hyperkalemia: Secondary | ICD-10-CM | POA: Diagnosis present

## 2020-02-09 DIAGNOSIS — E1065 Type 1 diabetes mellitus with hyperglycemia: Secondary | ICD-10-CM

## 2020-02-09 DIAGNOSIS — K5900 Constipation, unspecified: Secondary | ICD-10-CM | POA: Diagnosis present

## 2020-02-09 DIAGNOSIS — Z794 Long term (current) use of insulin: Secondary | ICD-10-CM

## 2020-02-09 DIAGNOSIS — E1110 Type 2 diabetes mellitus with ketoacidosis without coma: Secondary | ICD-10-CM | POA: Diagnosis not present

## 2020-02-09 DIAGNOSIS — Z8249 Family history of ischemic heart disease and other diseases of the circulatory system: Secondary | ICD-10-CM

## 2020-02-09 DIAGNOSIS — I10 Essential (primary) hypertension: Secondary | ICD-10-CM | POA: Diagnosis present

## 2020-02-09 DIAGNOSIS — E870 Hyperosmolality and hypernatremia: Secondary | ICD-10-CM | POA: Diagnosis present

## 2020-02-09 DIAGNOSIS — U071 COVID-19: Secondary | ICD-10-CM | POA: Diagnosis present

## 2020-02-09 DIAGNOSIS — Z79899 Other long term (current) drug therapy: Secondary | ICD-10-CM

## 2020-02-09 DIAGNOSIS — E876 Hypokalemia: Secondary | ICD-10-CM | POA: Diagnosis not present

## 2020-02-09 DIAGNOSIS — E1010 Type 1 diabetes mellitus with ketoacidosis without coma: Secondary | ICD-10-CM | POA: Diagnosis present

## 2020-02-09 LAB — BLOOD GAS, VENOUS
Acid-base deficit: 9.3 mmol/L — ABNORMAL HIGH (ref 0.0–2.0)
Bicarbonate: 15.8 mmol/L — ABNORMAL LOW (ref 20.0–28.0)
O2 Saturation: 84.8 %
Patient temperature: 98.6
pCO2, Ven: 33.2 mmHg — ABNORMAL LOW (ref 44.0–60.0)
pH, Ven: 7.298 (ref 7.250–7.430)
pO2, Ven: 59 mmHg — ABNORMAL HIGH (ref 32.0–45.0)

## 2020-02-09 LAB — CBC
HCT: 43.9 % (ref 36.0–46.0)
Hemoglobin: 13.4 g/dL (ref 12.0–15.0)
MCH: 25.8 pg — ABNORMAL LOW (ref 26.0–34.0)
MCHC: 30.5 g/dL (ref 30.0–36.0)
MCV: 84.4 fL (ref 80.0–100.0)
Platelets: 370 10*3/uL (ref 150–400)
RBC: 5.2 MIL/uL — ABNORMAL HIGH (ref 3.87–5.11)
RDW: 12.9 % (ref 11.5–15.5)
WBC: 9.4 10*3/uL (ref 4.0–10.5)
nRBC: 0 % (ref 0.0–0.2)

## 2020-02-09 LAB — BASIC METABOLIC PANEL
Anion gap: 27 — ABNORMAL HIGH (ref 5–15)
BUN: 18 mg/dL (ref 6–20)
CO2: 16 mmol/L — ABNORMAL LOW (ref 22–32)
Calcium: 10.4 mg/dL — ABNORMAL HIGH (ref 8.9–10.3)
Chloride: 98 mmol/L (ref 98–111)
Creatinine, Ser: 0.93 mg/dL (ref 0.44–1.00)
GFR calc Af Amer: >60 mL/min (ref >60)
GFR calc non Af Amer: >60 mL/min (ref >60)
Glucose, Bld: 572 mg/dL (ref 70–99)
Potassium: 5.6 mmol/L — ABNORMAL HIGH (ref 3.5–5.1)
Sodium: 141 mmol/L (ref 135–145)

## 2020-02-09 LAB — CBG MONITORING, ED: Glucose-Capillary: 563 mg/dL (ref 70–99)

## 2020-02-09 LAB — URINALYSIS, ROUTINE W REFLEX MICROSCOPIC
Bacteria, UA: NONE SEEN
Bilirubin Urine: NEGATIVE
Glucose, UA: >=500 mg/dL — AB
Hgb urine dipstick: NEGATIVE
Ketones, ur: 80 mg/dL — AB
Leukocytes,Ua: NEGATIVE
Nitrite: NEGATIVE
Protein, ur: NEGATIVE mg/dL
Specific Gravity, Urine: 1.028 (ref 1.005–1.030)
pH: 5 (ref 5.0–8.0)

## 2020-02-09 LAB — I-STAT BETA HCG BLOOD, ED (MC, WL, AP ONLY): I-stat hCG, quantitative: <5 m[IU]/mL (ref <5)

## 2020-02-09 LAB — BETA-HYDROXYBUTYRIC ACID: Beta-Hydroxybutyric Acid: >8 mmol/L — ABNORMAL HIGH (ref 0.05–0.27)

## 2020-02-09 MED ORDER — DEXTROSE 50 % IV SOLN: 0.0000 mL | INTRAVENOUS | Status: DC | PRN

## 2020-02-09 MED ORDER — ACETAMINOPHEN 650 MG RE SUPP: 650.0000 mg | Freq: Four times a day (QID) | RECTAL | Status: DC | PRN

## 2020-02-09 MED ORDER — ENOXAPARIN SODIUM 40 MG/0.4ML ~~LOC~~ SOLN
40.0000 mg | SUBCUTANEOUS | Status: DC
Start: 2020-02-10 — End: 2020-02-09

## 2020-02-09 MED ORDER — ENOXAPARIN SODIUM 40 MG/0.4ML ~~LOC~~ SOLN
40.0000 mg | SUBCUTANEOUS | Status: DC
  Administered 2020-02-10 – 2020-02-11 (×2): 40 mg via SUBCUTANEOUS
  Filled 2020-02-09 (×2): qty 0.4

## 2020-02-09 MED ORDER — ONDANSETRON HCL 4 MG/2ML IJ SOLN
4.0000 mg | Freq: Four times a day (QID) | INTRAMUSCULAR | Status: DC | PRN
  Administered 2020-02-10: 4 mg via INTRAVENOUS
  Filled 2020-02-09: qty 2

## 2020-02-09 MED ORDER — LACTATED RINGERS IV BOLUS
2000.0000 mL | Freq: Once | INTRAVENOUS | Status: AC
  Administered 2020-02-09: 2000 mL via INTRAVENOUS

## 2020-02-09 MED ORDER — INSULIN REGULAR(HUMAN) IN NACL 100-0.9 UT/100ML-% IV SOLN
INTRAVENOUS | Status: DC
  Administered 2020-02-10 (×2): 9 [IU]/h via INTRAVENOUS
  Administered 2020-02-11: 19 [IU]/h via INTRAVENOUS
  Filled 2020-02-09 (×3): qty 100

## 2020-02-09 MED ORDER — DEXTROSE-NACL 5-0.45 % IV SOLN: INTRAVENOUS | Status: DC

## 2020-02-09 MED ORDER — ONDANSETRON HCL 4 MG/2ML IJ SOLN
4.0000 mg | Freq: Once | INTRAMUSCULAR | Status: AC
  Administered 2020-02-09: 4 mg via INTRAVENOUS
  Filled 2020-02-09: qty 2

## 2020-02-09 MED ORDER — ACETAMINOPHEN 325 MG PO TABS
650.0000 mg | ORAL_TABLET | Freq: Four times a day (QID) | ORAL | Status: DC | PRN
  Administered 2020-02-13: 650 mg via ORAL
  Filled 2020-02-09: qty 2

## 2020-02-09 MED ORDER — ONDANSETRON HCL 4 MG PO TABS: 4.0000 mg | ORAL_TABLET | Freq: Four times a day (QID) | ORAL | Status: DC | PRN

## 2020-02-09 MED ORDER — SODIUM CHLORIDE 0.9 % IV SOLN: INTRAVENOUS | Status: DC

## 2020-02-09 NOTE — ED Triage Notes (Signed)
Patient arrives complaining of hyperglycemia. Admitted 7/18-7/21, COVID + for DKA. Patient states asymptomatic of COVID. Patient was switched to an insulin pen on admission and she states she has been compliant with her medications.

## 2020-02-09 NOTE — ED Provider Notes (Signed)
[facility]-EMERGENCY DEPT Provider Note   CSN: [id number] Arrival date & time: 02/09/20  2015     History Chief Complaint  Patient presents with  . Hyperglycemia  . Fatigue    Zoe Fox is a 24 y.o. female.  24 yo F with a cc of hyperglycemia.  Also having n/v and not keeping anything down for the past 48 hours.  No abdominal pain, no fevers.  No cough, sob.  No headaches.   The history is provided by the patient.  Hyperglycemia Associated symptoms: nausea and vomiting   Associated symptoms: no abdominal pain, no chest pain, no dizziness, no dysuria, no fever and no shortness of breath   Illness Severity:  Moderate Onset quality:  Gradual Duration:  2 days Timing:  Constant Progression:  Worsening Chronicity:  New Associated symptoms: nausea and vomiting   Associated symptoms: no abdominal pain, no chest pain, no congestion, no fever, no headaches, no myalgias, no rhinorrhea, no shortness of breath and no wheezing        Past Medical History:  Diagnosis Date  . Anemia   . Anxiety   . Depression   . Diabetes mellitus without complication (HCC)   . Hypertension     Patient Active Problem List   Diagnosis Date Noted  . DKA (diabetic ketoacidoses) (HCC) 02/03/2020  . Metabolic acidosis 02/03/2020  . Tachycardia 02/03/2020  . Type I diabetes mellitus (HCC) 06/27/2019    History reviewed. No pertinent surgical history.   OB History    Gravida  1   Para      Term      Preterm      AB  1   Living        SAB      TAB      Ectopic      Multiple      Live Births              Family History  Problem Relation Age of Onset  . Hypertension Mother   . Cancer Maternal Grandfather     Social History   Tobacco Use  . Smoking status: Never Smoker  . Smokeless tobacco: Never Used  Vaping Use  . Vaping Use: Former  Substance Use Topics  . Alcohol use: Never  . Drug use: Never    Home Medications Prior to  Admission medications   Medication Sig Start Date End Date Taking? Authorizing Provider  acetaminophen (TYLENOL) 325 MG tablet Take 650 mg by mouth every 6 (six) hours as needed for mild pain or headache.    [provider]  ascorbic acid (VITAMIN C) 500 MG tablet Take 1 tablet (500 mg total) by mouth daily. 02/04/20   Rhetta Mura, MD  enalapril (VASOTEC) 10 MG tablet Take 10 mg by mouth daily.  12/24/15   [provider]  fluticasone (FLONASE) 50 MCG/ACT nasal spray Place 2 sprays into both nostrils daily for 10 days. Patient not taking: Reported on 02/03/2020 08/17/18 08/27/18  Benay Pike, NP  insulin aspart (NOVOLOG FLEXPEN) 100 UNIT/ML FlexPen Inject 5-15 Units into the skin 3 (three) times daily with meals. 02/04/20   Rhetta Mura, MD  insulin glargine (LANTUS) 100 UNIT/ML injection Inject 0.3 mLs (30 Units total) into the skin daily. 02/04/20   Rhetta Mura, MD  nitrofurantoin, macrocrystal-monohydrate, (MACROBID) 100 MG capsule Take 100 mg by mouth at bedtime. 10 day supply 01/31/20   [provider]    Allergies  Patient has no known allergies.  Review of Systems   Review of Systems  Constitutional: Negative for chills and fever.  HENT: Negative for congestion and rhinorrhea.   Eyes: Negative for redness and visual disturbance.  Respiratory: Negative for shortness of breath and wheezing.   Cardiovascular: Negative for chest pain and palpitations.  Gastrointestinal: Positive for nausea and vomiting. Negative for abdominal pain.  Genitourinary: Negative for dysuria and urgency.  Musculoskeletal: Negative for arthralgias and myalgias.  Skin: Negative for pallor and wound.  Neurological: Negative for dizziness and headaches.    Physical Exam Updated Vital Signs BP 123/80 (BP Location: Right Arm)   Pulse 101   Temp 97.8 F (36.6 C) (Oral)   Resp 16   LMP  (LMP Unknown)   SpO2 97%   Physical Exam Vitals and nursing note  reviewed.  Constitutional:      General: She is not in acute distress.    Appearance: She is well-developed. She is not diaphoretic.  HENT:     Head: Normocephalic and atraumatic.  Eyes:     Pupils: Pupils are equal, round, and reactive to light.  Cardiovascular:     Rate and Rhythm: Normal rate and regular rhythm.     Heart sounds: No murmur heard.  No friction rub. No gallop.   Pulmonary:     Effort: Pulmonary effort is normal.     Breath sounds: No wheezing or rales.  Abdominal:     General: There is no distension.     Palpations: Abdomen is soft.     Tenderness: There is no abdominal tenderness.  Musculoskeletal:        General: No tenderness.     Cervical back: Normal range of motion and neck supple.  Skin:    General: Skin is warm and dry.  Neurological:     Mental Status: She is alert and oriented to person, place, and time.  Psychiatric:        Behavior: Behavior normal.     ED Results / Procedures / Treatments   Labs (all labs ordered are listed, but only abnormal results are displayed) Labs Reviewed  BASIC METABOLIC PANEL - Abnormal; Notable for the following components:      Result Value   Potassium 5.6 (*)    CO2 16 (*)    Glucose, Bld 572 (*)    Calcium 10.4 (*)    Anion gap 27 (*)    All other components within normal limits  CBC - Abnormal; Notable for the following components:   RBC 5.20 (*)    MCH 25.8 (*)    All other components within normal limits  URINALYSIS, ROUTINE W REFLEX MICROSCOPIC - Abnormal; Notable for the following components:   Color, Urine STRAW (*)    Glucose, UA >=500 (*)    Ketones, ur 80 (*)    All other components within normal limits  CBG MONITORING, ED - Abnormal; Notable for the following components:   Glucose-Capillary 563 (*)    All other components within normal limits  BLOOD GAS, VENOUS  BETA-HYDROXYBUTYRIC ACID  BASIC METABOLIC PANEL  BASIC METABOLIC PANEL  BASIC METABOLIC PANEL  BETA-HYDROXYBUTYRIC ACID  CBG  MONITORING, ED  I-STAT BETA HCG BLOOD, ED ([facility], [facility], [facility] ONLY)    EKG None  Radiology No results found.  Procedures Procedures (including critical care time)  Medications Ordered in ED Medications  lactated ringers bolus 2,000 mL (has no administration in time range)  ondansetron (ZOFRAN) injection 4 mg (has no  administration in time range)  insulin regular, human (MYXREDLIN) 100 units/ 100 mL infusion (has no administration in time range)  0.9 %  sodium chloride infusion (has no administration in time range)  dextrose 5 %-0.45 % sodium chloride infusion (has no administration in time range)  dextrose 50 % solution 0-50 mL (has no administration in time range)    ED Course  I have reviewed the triage vital signs and the nursing notes.  Pertinent labs & imaging results that were available during my care of the patient were reviewed by me and considered in my medical decision making (see chart for details).    MDM Rules/Calculators/A&P                          24 yo F with a cc of n/v and hyperglycemia.  Going on for the past couple days.  Was just in the hospital for dka.  Here with concern for the same. UA with 80 ketones.  CBG 563.  Give 2L of fluids. Awaiting metabolic panel.   K mildly elevated.  Bicarb 16, gap 27.  Will start on insulin gtt.  Discuss with medicine.   CRITICAL CARE Performed by: Rae Roam   Total critical care time: 35 minutes  Critical care time was exclusive of separately billable procedures and treating other patients.  Critical care was necessary to treat or prevent imminent or life-threatening deterioration.  Critical care was time spent personally by me on the following activities: development of treatment plan with patient and/or surrogate as well as nursing, discussions with consultants, evaluation of patient's response to treatment, examination of patient, obtaining history from patient or surrogate, ordering and performing treatments  and interventions, ordering and review of laboratory studies, ordering and review of radiographic studies, pulse oximetry and re-evaluation of patient's condition.   The patients results and plan were reviewed and discussed.   Any x-rays performed were independently reviewed by myself.   Differential diagnosis were considered with the presenting HPI.  Medications  lactated ringers bolus 2,000 mL (has no administration in time range)  ondansetron (ZOFRAN) injection 4 mg (has no administration in time range)  insulin regular, human (MYXREDLIN) 100 units/ 100 mL infusion (has no administration in time range)  0.9 %  sodium chloride infusion (has no administration in time range)  dextrose 5 %-0.45 % sodium chloride infusion (has no administration in time range)  dextrose 50 % solution 0-50 mL (has no administration in time range)    Vitals:   02/09/20 2028 02/09/20 2032  BP: 123/80   Pulse: 101   Resp: 16   Temp: 97.8 F (36.6 C)   TempSrc: Oral   SpO2: 100% 97%    Final diagnoses:  Diabetic ketoacidosis without coma associated with type 1 diabetes mellitus (HCC)    Admission/ observation were discussed with the admitting physician, patient and/or family and they are comfortable with the plan.     Final Clinical Impression(s) / ED Diagnoses Final diagnoses:  Diabetic ketoacidosis without coma associated with type 1 diabetes mellitus [facility])    Rx / DC Orders ED Discharge Orders    None       Melene Plan, DO 02/09/20 2302

## 2020-02-09 NOTE — H&P (Addendum)
History and Physical    Zoe Fox BWL:[id number] DOB: [birth year] DOA: 02/09/2020  PCP: Patient, No Pcp Per  Patient coming from: Home.  I have personally briefly reviewed patient's old medical records in [facility]  Chief Complaint: High blood glucose.  HPI: Zoe Fox is a 24 y.o. female with medical history significant of anemia, anxiety, depression, type 1 diabetes, recently discharged from the hospital due to COVID-19 illness who is coming to the emergency department with complaints of hyperglycemia associated with epigastric abdominal pain, constipation, nausea and vomiting for the past 2 days.  No melena, hematochezia, dysuria, frequency or hematuria.  She denies fever, chills, dyspnea, chest pain, palpitations, diaphoresis, PND, orthopnea or pitting edema of the lower extremities.  ED Course: Initial vital signs were temperature 97.8 F, pulse 101, respirations, BP 123/80 mmHg and O2 sat 100% on room air.  The patient received 2000 mL of LR bolus and a continuous insulin infusion was started.  Urinalysis showed glucosuria more than 500 and ketonuria of 80 mg/dL.  CBC showed a white count of 9.4, hemoglobin 13.4 g/dL and platelets 681.  I-STAT hCG was negative.  BMP showed a sodium 141 (corrected to hyperglycemia is 152), potassium 5.6, chloride 98 and CO2 16 mmol/L.  Glucose 572, BUN 18, creatinine 0.93 and calcium 10.4 mg/dL.  Beta hydroxybutyric acid was more than 8.00 mmol/L.  Anion gap was 27. Venous blood gas showed a pH of 7.298, PCO2 33.2 and PO2 59.0 mmHg.  Bicarbonate was 15.8 and acid base deficit 9.3 mmol/L.  Review of Systems: As per HPI otherwise all other systems reviewed and are negative.  Past Medical History:  Diagnosis Date  . Anemia   . Anxiety   . Depression   . Diabetes mellitus type I   . Hypertension     History reviewed. No pertinent surgical history.  Social History  reports that she has never smoked. She has never used smokeless  tobacco. She reports that she does not drink alcohol and does not use drugs.  No Known Allergies  Family History  Problem Relation Age of Onset  . Hypertension Mother   . Cancer Maternal Grandfather    Prior to Admission medications   Medication Sig Start Date End Date Taking? Authorizing Provider  acetaminophen (TYLENOL) 325 MG tablet Take 650 mg by mouth every 6 (six) hours as needed for mild pain or headache.    [provider]  ascorbic acid (VITAMIN C) 500 MG tablet Take 1 tablet (500 mg total) by mouth daily. 02/04/20   Rhetta Mura, MD  enalapril (VASOTEC) 10 MG tablet Take 10 mg by mouth daily.  12/24/15   [provider]  fluticasone (FLONASE) 50 MCG/ACT nasal spray Place 2 sprays into both nostrils daily for 10 days. Patient not taking: Reported on 02/03/2020 08/17/18 08/27/18  Benay Pike, NP  insulin aspart (NOVOLOG FLEXPEN) 100 UNIT/ML FlexPen Inject 5-15 Units into the skin 3 (three) times daily with meals. 02/04/20   Rhetta Mura, MD  insulin glargine (LANTUS) 100 UNIT/ML injection Inject 0.3 mLs (30 Units total) into the skin daily. 02/04/20   Rhetta Mura, MD  nitrofurantoin, macrocrystal-monohydrate, (MACROBID) 100 MG capsule Take 100 mg by mouth at bedtime. 10 day supply 01/31/20   [provider]    Physical Exam: Vitals:   02/09/20 2028 02/09/20 2032 02/09/20 2314  BP: 123/80  120/84  Pulse: 101  99  Resp: 16  15  Temp: 97.8 F (36.6 C)  TempSrc: Oral    SpO2: 100% 97% 98%    Constitutional: NAD, calm, comfortable Eyes: PERRL, lids and conjunctivae mildly injected. ENMT: Mucous membranes are dry. Posterior pharynx clear of any exudate or lesions. Neck: normal, supple, no masses, no thyromegaly Respiratory: clear to auscultation bilaterally, no wheezing, no crackles. Normal respiratory effort. No accessory muscle use.  Cardiovascular: Tachycardic at 103 bpm, no murmurs / rubs / gallops. No extremity edema.  2+ pedal pulses. No carotid bruits.  Abdomen: Nondistended.  BS positive.  Soft, positive for gastric tenderness, no guarding or rebound, no masses palpated. No hepatosplenomegaly.  Musculoskeletal: no clubbing / cyanosis. Good ROM, no contractures. Normal muscle tone.  Skin: no no complaints from the patient or visible rashes, lesions, ulcers on limited dermatological examination. Neurologic: CN 2-12 grossly intact. Sensation intact, DTR normal. Strength 5/5 in all 4.  Psychiatric: Normal judgment and insight. Alert and oriented x 3. Normal mood.   Labs on Admission: I have personally reviewed following labs and imaging studies  CBC: Recent Labs  Lab 02/03/20 0812 02/03/20 1026 02/04/20 0316 02/09/20 2120  WBC 6.3 7.6 7.6 9.4  HGB 14.8 13.0 13.4 13.4  HCT 49.8* 44.0 42.2 43.9  MCV 85.6 85.3 80.8 84.4  PLT 322 274 250 370    Basic Metabolic Panel: Recent Labs  Lab 02/03/20 2357 02/04/20 0316 02/05/20 0418 02/06/20 0936 02/09/20 2120  NA 139 138 136 140 141  K 3.1* 3.2* 2.8* 3.4* 5.6*  CL 107 108 104 108 98  CO2 16* 19* 21* 24 16*  GLUCOSE 206* 191* 352* 157* 572*  BUN 9 8 8 13 18   CREATININE 0.52 0.55 0.59 0.55 0.93  CALCIUM 8.9 8.8* 8.5* 8.5* 10.4*  MG  --   --  1.9  --   --     GFR: Estimated Creatinine Clearance: 81.6 mL/min (by C-G formula based on SCr of 0.93 mg/dL).  Liver Function Tests: Recent Labs  Lab 02/04/20 0316 02/05/20 0418  AST 49* 35  ALT 48* 37  ALKPHOS 80 82  BILITOT 0.3 0.5  PROT 6.8 6.3*  ALBUMIN 3.5 3.1*    Urine analysis:    Component Value Date/Time   COLORURINE STRAW (A) 02/09/2020 2122   APPEARANCEUR CLEAR 02/09/2020 2122   LABSPEC 1.028 02/09/2020 2122   PHURINE 5.0 02/09/2020 2122   GLUCOSEU >=500 (A) 02/09/2020 2122   HGBUR NEGATIVE 02/09/2020 2122   BILIRUBINUR NEGATIVE 02/09/2020 2122   KETONESUR 80 (A) 02/09/2020 2122   PROTEINUR NEGATIVE 02/09/2020 2122   NITRITE NEGATIVE 02/09/2020 2122   LEUKOCYTESUR NEGATIVE  02/09/2020 2122    Radiological Exams on Admission: No results found.  EKG: Independently reviewed.   Assessment/Plan Principal Problem:   DKA (diabetic ketoacidoses) (HCC) Observation/stepdown. Keep n.p.o. for now Continue IV fluids. Antiemetics as needed. Analgesics as needed. Protonix 40 mg IVP q 24 hr. Continue insulin infusion. Correct electrolytes PRN. Close CBG monitoring. Follow BMP and ketone level.  Active Problems:   Hypertension Hold enalapril due to hyperkalemia. Monitor blood pressure. Parenteral labetalol as needed.    Hyperkalemia Hold enalapril. Should decrease with IVF/insulin infusion. Follow-up potassium level.    Hypernatremia Initial corrected sodium was 152 mmol/L. Started earlier on NaCl 0.45% infusion. Follow-up sodium level.    Hypercalcemia Dehydration? Repeat calcium level. Further work-up if hypercalcemia persists   DVT prophylaxis: Lovenox SQ. Code Status:   Full code. Family Communication: Disposition Plan:   Patient is from:  Home.  Anticipated DC to:  Home.  Anticipated DC date:  02/10/2020 or 02/11/2020.  Anticipated DC barriers: Clinical status.  Consults called:  Diabetes coordinator. Admission status:  Observation/stepdown.    Severity of Illness: High due to DKA with moderate acidosis requiring fluid resuscitation and a continuous insulin infusion.  Bobette Mo MD [facility]  How to contact the [facility] Attending or Consulting provider 7A - 7P or covering provider during after hours 7P -7A, for this patient?   1. Check the care team in [facility] and look for a) attending/consulting TR[facility] provider listed and b) the [facility] team listed 2. Log into [email or web address] and use [facility] universal password to access. If you do not have the password, please contact the hospital operator. 3. Locate the [facility] provider you are looking for under [facility] and page to a number that you can be directly reached. 4. If you  still have difficulty reaching the provider, please page the [facility] (Director on Call) for the Hospitalists listed on amion for assistance.  02/09/2020, 11:54 PM   This document was prepared using Dragon voice recognition software and may contain some unintended transcription errors.

## 2020-02-10 DIAGNOSIS — E875 Hyperkalemia: Secondary | ICD-10-CM | POA: Diagnosis present

## 2020-02-10 DIAGNOSIS — Z8249 Family history of ischemic heart disease and other diseases of the circulatory system: Secondary | ICD-10-CM | POA: Diagnosis not present

## 2020-02-10 DIAGNOSIS — I10 Essential (primary) hypertension: Secondary | ICD-10-CM | POA: Diagnosis present

## 2020-02-10 DIAGNOSIS — E870 Hyperosmolality and hypernatremia: Secondary | ICD-10-CM | POA: Diagnosis present

## 2020-02-10 DIAGNOSIS — E1110 Type 2 diabetes mellitus with ketoacidosis without coma: Secondary | ICD-10-CM | POA: Diagnosis present

## 2020-02-10 DIAGNOSIS — Z79899 Other long term (current) drug therapy: Secondary | ICD-10-CM | POA: Diagnosis not present

## 2020-02-10 DIAGNOSIS — Z794 Long term (current) use of insulin: Secondary | ICD-10-CM | POA: Diagnosis not present

## 2020-02-10 DIAGNOSIS — K5900 Constipation, unspecified: Secondary | ICD-10-CM | POA: Diagnosis present

## 2020-02-10 DIAGNOSIS — E1010 Type 1 diabetes mellitus with ketoacidosis without coma: Secondary | ICD-10-CM | POA: Diagnosis present

## 2020-02-10 DIAGNOSIS — E876 Hypokalemia: Secondary | ICD-10-CM | POA: Diagnosis not present

## 2020-02-10 DIAGNOSIS — U071 COVID-19: Secondary | ICD-10-CM | POA: Diagnosis present

## 2020-02-10 LAB — CBG MONITORING, ED
Glucose-Capillary: 127 mg/dL — ABNORMAL HIGH (ref 70–99)
Glucose-Capillary: 144 mg/dL — ABNORMAL HIGH (ref 70–99)
Glucose-Capillary: 148 mg/dL — ABNORMAL HIGH (ref 70–99)
Glucose-Capillary: 165 mg/dL — ABNORMAL HIGH (ref 70–99)
Glucose-Capillary: 170 mg/dL — ABNORMAL HIGH (ref 70–99)
Glucose-Capillary: 172 mg/dL — ABNORMAL HIGH (ref 70–99)
Glucose-Capillary: 176 mg/dL — ABNORMAL HIGH (ref 70–99)
Glucose-Capillary: 179 mg/dL — ABNORMAL HIGH (ref 70–99)
Glucose-Capillary: 198 mg/dL — ABNORMAL HIGH (ref 70–99)
Glucose-Capillary: 216 mg/dL — ABNORMAL HIGH (ref 70–99)
Glucose-Capillary: 239 mg/dL — ABNORMAL HIGH (ref 70–99)
Glucose-Capillary: 239 mg/dL — ABNORMAL HIGH (ref 70–99)
Glucose-Capillary: 252 mg/dL — ABNORMAL HIGH (ref 70–99)
Glucose-Capillary: 279 mg/dL — ABNORMAL HIGH (ref 70–99)
Glucose-Capillary: 297 mg/dL — ABNORMAL HIGH (ref 70–99)
Glucose-Capillary: 303 mg/dL — ABNORMAL HIGH (ref 70–99)
Glucose-Capillary: 369 mg/dL — ABNORMAL HIGH (ref 70–99)
Glucose-Capillary: 453 mg/dL — ABNORMAL HIGH (ref 70–99)
Glucose-Capillary: 536 mg/dL (ref 70–99)
Glucose-Capillary: 562 mg/dL (ref 70–99)

## 2020-02-10 LAB — BASIC METABOLIC PANEL
Anion gap: 24 — ABNORMAL HIGH (ref 5–15)
Anion gap: 9 (ref 5–15)
Anion gap: 9 (ref 5–15)
Anion gap: 14 (ref 5–15)
Anion gap: 12 (ref 5–15)
BUN: 17 mg/dL (ref 6–20)
BUN: 15 mg/dL (ref 6–20)
BUN: 15 mg/dL (ref 6–20)
BUN: 14 mg/dL (ref 6–20)
BUN: 14 mg/dL (ref 6–20)
CO2: 15 mmol/L — ABNORMAL LOW (ref 22–32)
CO2: 24 mmol/L (ref 22–32)
CO2: 25 mmol/L (ref 22–32)
CO2: 21 mmol/L — ABNORMAL LOW (ref 22–32)
CO2: 23 mmol/L (ref 22–32)
Calcium: 9.8 mg/dL (ref 8.9–10.3)
Calcium: 9.4 mg/dL (ref 8.9–10.3)
Calcium: 9 mg/dL (ref 8.9–10.3)
Calcium: 8.7 mg/dL — ABNORMAL LOW (ref 8.9–10.3)
Calcium: 8.7 mg/dL — ABNORMAL LOW (ref 8.9–10.3)
Chloride: 107 mmol/L (ref 98–111)
Chloride: 110 mmol/L (ref 98–111)
Chloride: 107 mmol/L (ref 98–111)
Chloride: 105 mmol/L (ref 98–111)
Chloride: 101 mmol/L (ref 98–111)
Creatinine, Ser: 0.88 mg/dL (ref 0.44–1.00)
Creatinine, Ser: 0.53 mg/dL (ref 0.44–1.00)
Creatinine, Ser: 0.58 mg/dL (ref 0.44–1.00)
Creatinine, Ser: 0.54 mg/dL (ref 0.44–1.00)
Creatinine, Ser: 0.71 mg/dL (ref 0.44–1.00)
GFR calc Af Amer: >60 mL/min (ref >60)
GFR calc Af Amer: >60 mL/min (ref >60)
GFR calc Af Amer: >60 mL/min (ref >60)
GFR calc Af Amer: >60 mL/min (ref >60)
GFR calc Af Amer: >60 mL/min (ref >60)
GFR calc non Af Amer: >60 mL/min (ref >60)
GFR calc non Af Amer: >60 mL/min (ref >60)
GFR calc non Af Amer: >60 mL/min (ref >60)
GFR calc non Af Amer: >60 mL/min (ref >60)
GFR calc non Af Amer: >60 mL/min (ref >60)
Glucose, Bld: 458 mg/dL — ABNORMAL HIGH (ref 70–99)
Glucose, Bld: 192 mg/dL — ABNORMAL HIGH (ref 70–99)
Glucose, Bld: 252 mg/dL — ABNORMAL HIGH (ref 70–99)
Glucose, Bld: 413 mg/dL — ABNORMAL HIGH (ref 70–99)
Glucose, Bld: 215 mg/dL — ABNORMAL HIGH (ref 70–99)
Potassium: 4.1 mmol/L (ref 3.5–5.1)
Potassium: 3.6 mmol/L (ref 3.5–5.1)
Potassium: 3.3 mmol/L — ABNORMAL LOW (ref 3.5–5.1)
Potassium: 4 mmol/L (ref 3.5–5.1)
Potassium: 3.2 mmol/L — ABNORMAL LOW (ref 3.5–5.1)
Sodium: 146 mmol/L — ABNORMAL HIGH (ref 135–145)
Sodium: 143 mmol/L (ref 135–145)
Sodium: 141 mmol/L (ref 135–145)
Sodium: 140 mmol/L (ref 135–145)
Sodium: 136 mmol/L (ref 135–145)

## 2020-02-10 LAB — HEPATIC FUNCTION PANEL
ALT: 33 U/L (ref 0–44)
AST: 29 U/L (ref 15–41)
Albumin: 4 g/dL (ref 3.5–5.0)
Alkaline Phosphatase: 105 U/L (ref 38–126)
Bilirubin, Direct: 0.1 mg/dL (ref 0.0–0.2)
Indirect Bilirubin: 1.3 mg/dL — ABNORMAL HIGH (ref 0.3–0.9)
Total Bilirubin: 1.4 mg/dL — ABNORMAL HIGH (ref 0.3–1.2)
Total Protein: 8.2 g/dL — ABNORMAL HIGH (ref 6.5–8.1)

## 2020-02-10 LAB — BETA-HYDROXYBUTYRIC ACID
Beta-Hydroxybutyric Acid: 0.38 mmol/L — ABNORMAL HIGH (ref 0.05–0.27)
Beta-Hydroxybutyric Acid: 2.24 mmol/L — ABNORMAL HIGH (ref 0.05–0.27)
Beta-Hydroxybutyric Acid: 0.06 mmol/L (ref 0.05–0.27)

## 2020-02-10 MED ORDER — ENALAPRIL MALEATE 10 MG PO TABS
10.0000 mg | ORAL_TABLET | Freq: Every day | ORAL | Status: DC
  Administered 2020-02-10 – 2020-02-13 (×4): 10 mg via ORAL
  Filled 2020-02-10 (×5): qty 1

## 2020-02-10 MED ORDER — INSULIN ASPART 100 UNIT/ML ~~LOC~~ SOLN
0.0000 [IU] | Freq: Three times a day (TID) | SUBCUTANEOUS | Status: DC
  Administered 2020-02-10: 5 [IU] via SUBCUTANEOUS
  Filled 2020-02-10: qty 0.15

## 2020-02-10 MED ORDER — SODIUM CHLORIDE 0.45 % IV SOLN: INTRAVENOUS | Status: DC

## 2020-02-10 MED ORDER — PANTOPRAZOLE SODIUM 40 MG IV SOLR
40.0000 mg | INTRAVENOUS | Status: DC
  Administered 2020-02-10 – 2020-02-11 (×2): 40 mg via INTRAVENOUS
  Filled 2020-02-10 (×3): qty 40

## 2020-02-10 MED ORDER — INSULIN GLARGINE 100 UNIT/ML ~~LOC~~ SOLN
30.0000 [IU] | Freq: Every day | SUBCUTANEOUS | Status: DC
  Administered 2020-02-10: 30 [IU] via SUBCUTANEOUS
  Filled 2020-02-10 (×2): qty 0.3

## 2020-02-10 MED ORDER — FENTANYL CITRATE (PF) 100 MCG/2ML IJ SOLN: 25.0000 ug | INTRAMUSCULAR | Status: DC | PRN

## 2020-02-10 NOTE — Progress Notes (Signed)
Inpatient Diabetes Program Recommendations  AACE/ADA: New Consensus Statement on Inpatient Glycemic Control (2015)  Target Ranges:  Prepandial:   less than 140 mg/dL      Peak postprandial:   less than 180 mg/dL (1-2 hours)      Critically ill patients:  140 - 180 mg/dL   Lab Results  Component Value Date   GLUCAP 170 (H) 02/10/2020   HGBA1C >15.5 (H) 02/04/2020    Review of Glycemic Control  Diabetes history: DM type 1 Outpatient Diabetes medications: Lantus 30 units Daily, Novolog 5-15 units tid Current orders for Inpatient glycemic control:  IV insulin transitioning to home regimen Lantus 30 units Novolog 0-15 units tid   D/c'con 7/21 with DKA and found to be COVID +. Was not given steroids. Pt counseled by DM Coordinator on 7/19. Pt reported missing insulin doses and was suppose to be referred to Endocrinology.  Inpatient Diabetes Program Recommendations:    -Consider Novolog 5 units tid meal coverage since pt has type 1 DM and will need CHO coverage.  Thanks,  Christena Deem RN, MSN, BC-ADM Inpatient Diabetes Coordinator Team Pager [phone] (8a-5p)

## 2020-02-10 NOTE — ED Notes (Signed)
Patient given Ice chips  

## 2020-02-10 NOTE — Progress Notes (Signed)
PROGRESS NOTE  Zoe Fox  IRW:[id number] DOB: 12-13-1995 DOA: 02/09/2020 PCP: Patient, No Pcp Per  Brief Narrative: Zoe Fox is a 24 y.o. female with a history of T1DM, recent diagnosis of covid-19 (7/18) s/p regeneron 7/21 and recent admission for DKA who returned to the ED with severe hyperglycemia, abdominal pain, nausea and vomiting found to be in DKA despite reporting adherence to insulin at home. IV insulin and IVF were given with improvement. IV insulin was transitioned to basal-bolus insulin the morning after admission with return of acidosis, increase in anion gap and measures ketones, associated with recurrent hyperglycemia. IV insulin is restarted and SDU bed is requested.  Assessment & Plan: Principal Problem:   DKA (diabetic ketoacidoses) (HCC) Active Problems:   Hypertension   Hyperkalemia   Hypercalcemia   Hypernatremia  DKA, poorly-controlled T1DM: HbA1c >15.5% indicative of AVERAGE blood sugar of nearly 400mg /dl. Unclear precipitant of recurrent DKA since patient reports adherence and novolog was prescribed at recent discharge. Covid-19 can certainly increase blood sugars itself, though CBGs were better controlled at time of discharge as well on home regimen. - Must restart insulin gtt per endotool. Continue serial serum ketones, BMP. Lantus 30 units given earlier. Would recommend increased dosing going forward.  - Restart non-dextrose IVF while glucose >250mg /dl, then per protocol to D5 1/2NS. - Diabetes coordinator consulted  Hypokalemia: Resolved.  - Continue monitoring and supplement as appropriate.   Covid-19 infection: Dx 7/18 and require isolation for 10 days (based on designation of mild/moderate disease. s/p regeneron.   DVT prophylaxis: Lovenox Code Status: Full Family Communication: None at bedside Disposition Plan:  Status is: Inpatient  Remains inpatient appropriate because:Persistent severe electrolyte disturbances   Dispo: The  patient is from: Home              Anticipated d/c is to: Home              Anticipated d/c date is: 2 days              Patient currently is not medically stable to d/c.  Consultants:   None  Procedures:   None  Antimicrobials:  None   Subjective: Abdominal discomfort is slightly improved, still nauseated despite having not eaten at time of interview in the ED. No other new complaints. Denies dyspnea, chest pain, leg swelling. Confirms she was taking novolog 15 units three times daily and long acting insulin once daily.   Objective: Vitals:   02/10/20 1603 02/10/20 1613 02/10/20 1623 02/10/20 1633  BP:  (!) 131/105    Pulse: 82 82 82 82  Resp: 12 14 15 15   Temp:      TempSrc:      SpO2: 99% 99% 99% 98%    Intake/Output Summary (Last 24 hours) at 02/10/2020 1710 Last data filed at 02/10/2020 0421 Gross per 24 hour  Intake 2362.5 ml  Output --  Net 2362.5 ml   Gen: 24 y.o. female in no distress, drowsy but rousable. Pulm: Non-labored breathing. Clear to auscultation bilaterally.  CV: Regular rate and rhythm. No murmur, rub, or gallop. No JVD, no pedal edema. GI: Abdomen soft, non-tender, non-distended, with normoactive bowel sounds. No organomegaly or masses felt. Ext: Warm, no deformities Skin: No rashes, lesions no ulcers Neuro: Alert and oriented. No focal neurological deficits. Psych: Judgement and insight appear normal. Mood & affect appropriate.   Data Reviewed: I have personally reviewed following labs and imaging studies  CBC: Recent Labs  Lab 02/04/20 0316 02/09/20  2120  WBC 7.6 9.4  HGB 13.4 13.4  HCT 42.2 43.9  MCV 80.8 84.4  PLT 250 370   Basic Metabolic Panel: Recent Labs  Lab 02/05/20 0418 02/06/20 0936 02/09/20 2120 02/10/20 0132 02/10/20 0500 02/10/20 0918 02/10/20 1219  NA 136   < > 141 146* 143 141 140  K 2.8*   < > 5.6* 4.1 3.6 3.3* 4.0  CL 104   < > 98 107 110 107 105  CO2 21*   < > 16* 15* 24 25 21*  GLUCOSE 352*   < > 572*  458* 192* 252* 413*  BUN 8   < > 18 17 15 15 14   CREATININE 0.59   < > 0.93 0.88 0.53 0.58 0.54  CALCIUM 8.5*   < > 10.4* 9.8 9.4 9.0 8.7*  MG 1.9  --   --   --   --   --   --    < > = values in this interval not displayed.   GFR: Estimated Creatinine Clearance: 94.9 mL/min (by C-G formula based on SCr of 0.54 mg/dL). Liver Function Tests: Recent Labs  Lab 02/04/20 0316 02/05/20 0418 02/09/20 2122  AST 49* 35 29  ALT 48* 37 33  ALKPHOS 80 82 105  BILITOT 0.3 0.5 1.4*  PROT 6.8 6.3* 8.2*  ALBUMIN 3.5 3.1* 4.0   CBG: Recent Labs  Lab 02/10/20 1034 02/10/20 1207 02/10/20 1440 02/10/20 1527 02/10/20 1631  GLUCAP 148* 239* 303* 297* 252*   Urine analysis:    Component Value Date/Time   COLORURINE STRAW (A) 02/09/2020 2122   APPEARANCEUR CLEAR 02/09/2020 2122   LABSPEC 1.028 02/09/2020 2122   PHURINE 5.0 02/09/2020 2122   GLUCOSEU >=500 (A) 02/09/2020 2122   HGBUR NEGATIVE 02/09/2020 2122   BILIRUBINUR NEGATIVE 02/09/2020 2122   KETONESUR 80 (A) 02/09/2020 2122   PROTEINUR NEGATIVE 02/09/2020 2122   NITRITE NEGATIVE 02/09/2020 2122   LEUKOCYTESUR NEGATIVE 02/09/2020 2122   Recent Results (from the past 240 hour(s))  SARS Coronavirus 2 by RT PCR (hospital order, performed in [facility] hospital lab) Nasopharyngeal Nasopharyngeal Swab     Status: Abnormal   Collection Time: 02/03/20 10:21 AM   Specimen: Nasopharyngeal Swab  Result Value Ref Range Status   SARS Coronavirus 2 POSITIVE (A) NEGATIVE Final    Comment: RESULT CALLED TO, READ BACK BY AND VERIFIED WITH: JOSEPH,M RN @1305  ON 02/03/20 JACKSON,K (NOTE) SARS-CoV-2 target nucleic acids are DETECTED  SARS-CoV-2 RNA is generally detectable in upper respiratory specimens  during the acute phase of infection.  Positive results are indicative  of the presence of the identified virus, but do not rule out bacterial infection or co-infection with other pathogens not detected by the test.  Clinical correlation with  patient history and  other diagnostic information is necessary to determine patient infection status.  The expected result is negative.  Fact Sheet for Patients:     Fact Sheet for Healthcare Providers:   02/05/20    This test is not yet approved or cleared by the [email or web address] FDA and  has been authorized for detection and/or diagnosis of SARS-CoV-2 by FDA under an Emergency Use Authorization (EUA).  This EUA will remain in effect (meaning thi s test can be used) for the duration of  the COVID-19 declaration under Section 564(b)(1) of the Act, 21 U.S.C. section 360-bbb-3(b)(1), unless the authorization is terminated or revoked sooner.  Performed at [facility], [address] [place], [place]  [id number]   MRSA PCR Screening     Status: None   Collection Time: 02/03/20  2:23 PM   Specimen: Nasal Mucosa; Nasopharyngeal  Result Value Ref Range Status   MRSA by PCR NEGATIVE NEGATIVE Final    Comment:        The GeneXpert MRSA Assay (FDA approved for NASAL specimens only), is one component of a comprehensive MRSA colonization surveillance program. It is not intended to diagnose MRSA infection nor to guide or monitor treatment for MRSA infections. Performed at [facility], [address] [place], [place] [address]       Radiology Studies: No results found.  Scheduled Meds: . enalapril  10 mg Oral Daily  . enoxaparin (LOVENOX) injection  40 mg Subcutaneous Q24H  . insulin glargine  30 Units Subcutaneous Daily  . pantoprazole (PROTONIX) IV  40 mg Intravenous Q24H   Continuous Infusions: . sodium chloride 125 mL/hr at 02/10/20 1405  . dextrose 5 % and 0.45% NaCl Stopped (02/10/20 1407)  . insulin 8 Units/hr (02/10/20 1632)     LOS: 0 days   Time spent: 35 minutes.  Tyrone Nine, MD [facility] [email or web address] 02/10/2020, 5:10 PM

## 2020-02-10 NOTE — ED Notes (Signed)
I have attempted to draw BMET without success. Our phlebotomist will attempt shortly.

## 2020-02-11 DIAGNOSIS — I10 Essential (primary) hypertension: Secondary | ICD-10-CM | POA: Diagnosis not present

## 2020-02-11 DIAGNOSIS — E875 Hyperkalemia: Secondary | ICD-10-CM | POA: Diagnosis not present

## 2020-02-11 DIAGNOSIS — E870 Hyperosmolality and hypernatremia: Secondary | ICD-10-CM | POA: Diagnosis not present

## 2020-02-11 DIAGNOSIS — E1010 Type 1 diabetes mellitus with ketoacidosis without coma: Secondary | ICD-10-CM | POA: Diagnosis not present

## 2020-02-11 LAB — GLUCOSE, CAPILLARY
Glucose-Capillary: 245 mg/dL — ABNORMAL HIGH (ref 70–99)
Glucose-Capillary: 163 mg/dL — ABNORMAL HIGH (ref 70–99)
Glucose-Capillary: 121 mg/dL — ABNORMAL HIGH (ref 70–99)
Glucose-Capillary: 118 mg/dL — ABNORMAL HIGH (ref 70–99)
Glucose-Capillary: 113 mg/dL — ABNORMAL HIGH (ref 70–99)
Glucose-Capillary: 292 mg/dL — ABNORMAL HIGH (ref 70–99)
Glucose-Capillary: 336 mg/dL — ABNORMAL HIGH (ref 70–99)
Glucose-Capillary: 322 mg/dL — ABNORMAL HIGH (ref 70–99)

## 2020-02-11 LAB — CBC WITH DIFFERENTIAL/PLATELET
Abs Immature Granulocytes: 0.03 10*3/uL (ref 0.00–0.07)
Basophils Absolute: 0 10*3/uL (ref 0.0–0.1)
Basophils Relative: 0 %
Eosinophils Absolute: 0.1 10*3/uL (ref 0.0–0.5)
Eosinophils Relative: 1 %
HCT: 31.7 % — ABNORMAL LOW (ref 36.0–46.0)
Hemoglobin: 10.1 g/dL — ABNORMAL LOW (ref 12.0–15.0)
Immature Granulocytes: 0 %
Lymphocytes Relative: 51 %
Lymphs Abs: 4 10*3/uL (ref 0.7–4.0)
MCH: 25.9 pg — ABNORMAL LOW (ref 26.0–34.0)
MCHC: 31.9 g/dL (ref 30.0–36.0)
MCV: 81.3 fL (ref 80.0–100.0)
Monocytes Absolute: 0.6 10*3/uL (ref 0.1–1.0)
Monocytes Relative: 8 %
Neutro Abs: 3.2 10*3/uL (ref 1.7–7.7)
Neutrophils Relative %: 40 %
Platelets: 295 10*3/uL (ref 150–400)
RBC: 3.9 MIL/uL (ref 3.87–5.11)
RDW: 12.6 % (ref 11.5–15.5)
WBC: 7.9 10*3/uL (ref 4.0–10.5)
nRBC: 0 % (ref 0.0–0.2)

## 2020-02-11 LAB — BASIC METABOLIC PANEL
Anion gap: 12 (ref 5–15)
BUN: 12 mg/dL (ref 6–20)
CO2: 26 mmol/L (ref 22–32)
Calcium: 8.5 mg/dL — ABNORMAL LOW (ref 8.9–10.3)
Chloride: 101 mmol/L (ref 98–111)
Creatinine, Ser: 0.39 mg/dL — ABNORMAL LOW (ref 0.44–1.00)
GFR calc Af Amer: >60 mL/min (ref >60)
GFR calc non Af Amer: >60 mL/min (ref >60)
Glucose, Bld: 86 mg/dL (ref 70–99)
Potassium: 2.7 mmol/L — CL (ref 3.5–5.1)
Sodium: 139 mmol/L (ref 135–145)

## 2020-02-11 LAB — CBG MONITORING, ED
Glucose-Capillary: 189 mg/dL — ABNORMAL HIGH (ref 70–99)
Glucose-Capillary: 294 mg/dL — ABNORMAL HIGH (ref 70–99)
Glucose-Capillary: 295 mg/dL — ABNORMAL HIGH (ref 70–99)

## 2020-02-11 MED ORDER — INSULIN GLARGINE 100 UNIT/ML ~~LOC~~ SOLN
50.0000 [IU] | Freq: Every day | SUBCUTANEOUS | Status: DC
  Administered 2020-02-11 – 2020-02-12 (×2): 50 [IU] via SUBCUTANEOUS
  Filled 2020-02-11 (×3): qty 0.5

## 2020-02-11 MED ORDER — INSULIN ASPART 100 UNIT/ML ~~LOC~~ SOLN
0.0000 [IU] | Freq: Three times a day (TID) | SUBCUTANEOUS | Status: DC
  Administered 2020-02-11: 7 [IU] via SUBCUTANEOUS
  Administered 2020-02-12: 3 [IU] via SUBCUTANEOUS
  Administered 2020-02-12 (×2): 2 [IU] via SUBCUTANEOUS
  Administered 2020-02-13: 7 [IU] via SUBCUTANEOUS
  Administered 2020-02-13: 1 [IU] via SUBCUTANEOUS

## 2020-02-11 MED ORDER — INSULIN ASPART 100 UNIT/ML ~~LOC~~ SOLN
6.0000 [IU] | Freq: Three times a day (TID) | SUBCUTANEOUS | Status: DC
  Administered 2020-02-11: 6 [IU] via SUBCUTANEOUS

## 2020-02-11 MED ORDER — INSULIN ASPART 100 UNIT/ML ~~LOC~~ SOLN
0.0000 [IU] | Freq: Three times a day (TID) | SUBCUTANEOUS | Status: DC
  Administered 2020-02-11: 5 [IU] via SUBCUTANEOUS

## 2020-02-11 MED ORDER — SODIUM CHLORIDE 0.45 % IV BOLUS
1000.0000 mL | Freq: Once | INTRAVENOUS | Status: AC
  Administered 2020-02-11: 1000 mL via INTRAVENOUS

## 2020-02-11 MED ORDER — POTASSIUM CHLORIDE CRYS ER 20 MEQ PO TBCR
40.0000 meq | EXTENDED_RELEASE_TABLET | Freq: Once | ORAL | Status: AC
  Administered 2020-02-11: 40 meq via ORAL
  Filled 2020-02-11: qty 2

## 2020-02-11 MED ORDER — INSULIN ASPART 100 UNIT/ML ~~LOC~~ SOLN
0.0000 [IU] | Freq: Every day | SUBCUTANEOUS | Status: DC
  Administered 2020-02-11: 4 [IU] via SUBCUTANEOUS

## 2020-02-11 MED ORDER — INSULIN ASPART 100 UNIT/ML ~~LOC~~ SOLN: 0.0000 [IU] | Freq: Every day | SUBCUTANEOUS | Status: DC

## 2020-02-11 MED ORDER — CHLORHEXIDINE GLUCONATE CLOTH 2 % EX PADS
6.0000 | MEDICATED_PAD | Freq: Every day | CUTANEOUS | Status: DC
  Administered 2020-02-11 – 2020-02-12 (×2): 6 via TOPICAL

## 2020-02-11 MED ORDER — POTASSIUM CHLORIDE CRYS ER 20 MEQ PO TBCR
40.0000 meq | EXTENDED_RELEASE_TABLET | Freq: Two times a day (BID) | ORAL | Status: AC
  Administered 2020-02-11 – 2020-02-12 (×3): 40 meq via ORAL
  Filled 2020-02-11 (×3): qty 2

## 2020-02-11 MED ORDER — SODIUM CHLORIDE 0.45 % IV SOLN: INTRAVENOUS | Status: DC

## 2020-02-11 MED ORDER — DEXTROSE-NACL 5-0.45 % IV SOLN: INTRAVENOUS | Status: DC

## 2020-02-11 NOTE — Progress Notes (Signed)
TRH night shift.  Discontinue dextrose 5% plus NaCl 0.45% IVF due to hyperglycemia. A 1000 mL 0.45% NaCl bolus over 2 hours was ordered followed by 0.45% NaCl at 125 mL/h.  Sanda Klein, MD.

## 2020-02-11 NOTE — Plan of Care (Signed)
  Problem: Metabolic: Goal: Ability to maintain appropriate glucose levels will improve Outcome: Progressing   

## 2020-02-11 NOTE — TOC Progression Note (Signed)
Transition of Care [facility]) - Progression Note    Patient Details  Name: Cyani Kallstrom MRN: [id number] Date of Birth: [birth year]  Transition of [facility]) CM/SW Contact  Golda Acre, RN Phone Number: 02/11/2020, 10:02 AM  Clinical Narrative:    Remains in iv insulin p-following for toc needs.        Expected Discharge Plan and Services                                                 Social Determinants of Health (SDOH) Interventions    Readmission Risk Interventions No flowsheet data found.

## 2020-02-11 NOTE — ED Notes (Addendum)
Pt. CBG 294, RN,Sara,S. Made aware.

## 2020-02-11 NOTE — Progress Notes (Signed)
PROGRESS NOTE  Zoe Fox  JIR:[id number] DOB: [birth year] DOA: 02/09/2020 PCP: Patient, No Pcp Per  Brief Narrative: Zoe Fox is a 24 y.o. female with a history of T1DM, recent diagnosis of covid-19 (7/18) s/p regeneron 7/21 and recent admission for DKA who returned to the ED with severe hyperglycemia, abdominal pain, nausea and vomiting found to be in DKA despite reporting adherence to insulin at home. IV insulin and IVF were given with improvement. IV insulin was transitioned to basal-bolus insulin the morning after admission with return of acidosis, increase in anion gap and measures ketones, associated with recurrent hyperglycemia. IV insulin is restarted and SDU bed is requested.  Assessment & Plan: Principal Problem:   DKA (diabetic ketoacidoses) (HCC) Active Problems:   Hypertension   Hyperkalemia   Hypercalcemia   Hypernatremia  DKA, poorly-controlled T1DM: HbA1c >15.5% indicative of AVERAGE blood sugar of nearly 400mg /dl. Covid-19 can certainly increase blood sugars itself, though CBGs were better controlled at time of discharge as well on home regimen. - Given home lantus 30u yesterday with abrupt increase in ketones despite remaining NPO, so put back on insulin gtt. Will give lantus 50 units as the patient received approximately 100 units of insulin over the past 24 hours despite being NPO. D/w RN, DC IV insulin in 2 hours, start SSI and transfer to floor for 24 hours of monitoring to confirm acidosis doesn't recur.  - Starting diet, Continue D5 1/2NS until tolerating. - Diabetes coordinator consulted  Hypokalemia: Replaced this AM but level still 2.7. - Continue aggressive supplementation.   Covid-19 infection: Dx 7/18 and require isolation for 10 days (based on designation of mild/moderate disease. s/p regeneron.   DVT prophylaxis: Lovenox Code Status: Full Family Communication: None at bedside Disposition Plan:  Status is: Inpatient  Remains inpatient  appropriate because:Persistent severe electrolyte disturbances   Dispo: The patient is from: Home              Anticipated d/c is to: Home              Anticipated d/c date is: 2 days              Patient currently is not medically stable to d/c.  Consultants:   None  Procedures:   None  Antimicrobials:  None   Subjective: Feels better by indicating thumbs up from yesterday, mild abdominal pain as evidenced by hand gestures. Pt awake and alert, does not communicate verbally this morning. Says she could eat. No current nausea or vomiting.   Objective: Vitals:   02/11/20 0600 02/11/20 0700 02/11/20 0900 02/11/20 1000  BP: (!) 117/86 (!) 133/97 (!) 137/96 119/75  Pulse: 68 67 65 68  Resp: (!) 0 15 13 12   Temp:   97.9 F (36.6 C)   TempSrc:   Oral   SpO2: 98% 95% 100% 100%  Weight:        Intake/Output Summary (Last 24 hours) at 02/11/2020 1038 Last data filed at 02/11/2020 0729 Gross per 24 hour  Intake 2350.37 ml  Output --  Net 2350.37 ml   Gen: 24 y.o. female in no distress Pulm: Nonlabored breathing room air. Clear. CV: Regular rate and rhythm. No murmur, rub, or gallop. No JVD, no dependent edema. GI: Abdomen soft, non-tender, non-distended, with normoactive bowel sounds.  Ext: Warm, no deformities Skin: No rashes, lesions or ulcers on visualized skin. Neuro: Alert and oriented. No focal neurological deficits. Psych: Judgement and insight appear fair. Mood euthymic & affect congruent.  Behavior is appropriate.    Data Reviewed: I have personally reviewed following labs and imaging studies  CBC: Recent Labs  Lab 02/09/20 2120 02/11/20 0936  WBC 9.4 7.9  NEUTROABS  --  3.2  HGB 13.4 10.1*  HCT 43.9 31.7*  MCV 84.4 81.3  PLT 370 295   Basic Metabolic Panel: Recent Labs  Lab 02/05/20 0418 02/06/20 0936 02/10/20 0500 02/10/20 0918 02/10/20 1219 02/10/20 1900 02/11/20 0936  NA 136   < > 143 141 140 136 139  K 2.8*   < > 3.6 3.3* 4.0 3.2* 2.7*  CL  104   < > 110 107 105 101 101  CO2 21*   < > 24 25 21* 23 26  GLUCOSE 352*   < > 192* 252* 413* 215* 86  BUN 8   < > 15 15 14 14 12   CREATININE 0.59   < > 0.53 0.58 0.54 0.71 0.39*  CALCIUM 8.5*   < > 9.4 9.0 8.7* 8.7* 8.5*  MG 1.9  --   --   --   --   --   --    < > = values in this interval not displayed.   GFR: Estimated Creatinine Clearance: 100 mL/min (A) (by C-G formula based on SCr of 0.39 mg/dL (L)). Liver Function Tests: Recent Labs  Lab 02/05/20 0418 02/09/20 2122  AST 35 29  ALT 37 33  ALKPHOS 82 105  BILITOT 0.5 1.4*  PROT 6.3* 8.2*  ALBUMIN 3.1* 4.0   CBG: Recent Labs  Lab 02/11/20 0409 02/11/20 0513 02/11/20 0610 02/11/20 0726 02/11/20 0837  GLUCAP 245* 163* 121* 118* 113*   Urine analysis:    Component Value Date/Time   COLORURINE STRAW (A) 02/09/2020 2122   APPEARANCEUR CLEAR 02/09/2020 2122   LABSPEC 1.028 02/09/2020 2122   PHURINE 5.0 02/09/2020 2122   GLUCOSEU >=500 (A) 02/09/2020 2122   HGBUR NEGATIVE 02/09/2020 2122   BILIRUBINUR NEGATIVE 02/09/2020 2122   KETONESUR 80 (A) 02/09/2020 2122   PROTEINUR NEGATIVE 02/09/2020 2122   NITRITE NEGATIVE 02/09/2020 2122   LEUKOCYTESUR NEGATIVE 02/09/2020 2122   Recent Results (from the past 240 hour(s))  SARS Coronavirus 2 by RT PCR (hospital order, performed in [facility] hospital lab) Nasopharyngeal Nasopharyngeal Swab     Status: Abnormal   Collection Time: 02/03/20 10:21 AM   Specimen: Nasopharyngeal Swab  Result Value Ref Range Status   SARS Coronavirus 2 POSITIVE (A) NEGATIVE Final    Comment: RESULT CALLED TO, READ BACK BY AND VERIFIED WITH: JOSEPH,M RN @1305  ON 02/03/20 JACKSON,K (NOTE) SARS-CoV-2 target nucleic acids are DETECTED  SARS-CoV-2 RNA is generally detectable in upper respiratory specimens  during the acute phase of infection.  Positive results are indicative  of the presence of the identified virus, but do not rule out bacterial infection or co-infection with other pathogens  not detected by the test.  Clinical correlation with patient history and  other diagnostic information is necessary to determine patient infection status.  The expected result is negative.  Fact Sheet for Patients:     Fact Sheet for Healthcare Providers:   02/05/20    This test is not yet approved or cleared by the [employer] FDA and  has been authorized for detection and/or diagnosis of SARS-CoV-2 by FDA under an Emergency Use Authorization (EUA).  This EUA will remain in effect (meaning thi s test can be used) for the duration of  the COVID-19 declaration under Section 564(b)(1) of the  Act, 21 U.S.C. section 360-bbb-3(b)(1), unless the authorization is terminated or revoked sooner.  Performed at [facility], [address] [place], [place] [address]   MRSA PCR Screening     Status: None   Collection Time: 02/03/20  2:23 PM   Specimen: Nasal Mucosa; Nasopharyngeal  Result Value Ref Range Status   MRSA by PCR NEGATIVE NEGATIVE Final    Comment:        The GeneXpert MRSA Assay (FDA approved for NASAL specimens only), is one component of a comprehensive MRSA colonization surveillance program. It is not intended to diagnose MRSA infection nor to guide or monitor treatment for MRSA infections. Performed at [facility], [address] [place], [place] [address]       Radiology Studies: No results found.  Scheduled Meds: . Chlorhexidine Gluconate Cloth  6 each Topical Daily  . enalapril  10 mg Oral Daily  . enoxaparin (LOVENOX) injection  40 mg Subcutaneous Q24H  . insulin aspart  0-5 Units Subcutaneous QHS  . insulin aspart  0-9 Units Subcutaneous TID WC  . insulin glargine  50 Units Subcutaneous Daily  . pantoprazole (PROTONIX) IV  40 mg Intravenous Q24H  . potassium chloride  40 mEq Oral BID   Continuous Infusions: . dextrose 5 % and 0.45% NaCl        LOS: 1 day   Time spent: 35 minutes.  Tyrone Nine, MD [facility] [email or web address] 02/11/2020, 10:38 AM

## 2020-02-11 NOTE — Progress Notes (Signed)
Inpatient Diabetes Program Recommendations  AACE/ADA: New Consensus Statement on Inpatient Glycemic Control (2015)  Target Ranges:  Prepandial:   less than 140 mg/dL      Peak postprandial:   less than 180 mg/dL (1-2 hours)      Critically ill patients:  140 - 180 mg/dL   Lab Results  Component Value Date   GLUCAP 113 (H) 02/11/2020   HGBA1C >15.5 (H) 02/04/2020    Review of Glycemic Control  Diabetes history: DM1 Outpatient Diabetes medications: Lantus 30 units QD, Novolog 5-15 units tid Current orders for Inpatient glycemic control: IV insulin per [facility]  HgbA1C - > 15.5% Endo in [place].  Inpatient Diabetes Program Recommendations:     Ready for transition. Give Lantus 25 units 2h prior to discontinuing drip.  Novolog 0-9 units tidwc and hs Novolog 5 units tidwc for meal coverage insulin (Do not give if pt eats < 50% meal)  It is imperative that pt make appt with her Endo in [place] for OV. Pt must be skipping doses of insulin for HgbA1C to be > 15.5%. Spoke to pt at length when she was inpatient a couple of weeks ago.  Will follow closely.  Thank you. Ailene Ards, RD, LDN, CDE Inpatient Diabetes Coordinator [phone]

## 2020-02-12 DIAGNOSIS — E1010 Type 1 diabetes mellitus with ketoacidosis without coma: Secondary | ICD-10-CM | POA: Diagnosis not present

## 2020-02-12 DIAGNOSIS — E875 Hyperkalemia: Secondary | ICD-10-CM | POA: Diagnosis not present

## 2020-02-12 DIAGNOSIS — E870 Hyperosmolality and hypernatremia: Secondary | ICD-10-CM | POA: Diagnosis not present

## 2020-02-12 LAB — GLUCOSE, CAPILLARY
Glucose-Capillary: 241 mg/dL — ABNORMAL HIGH (ref 70–99)
Glucose-Capillary: 211 mg/dL — ABNORMAL HIGH (ref 70–99)
Glucose-Capillary: 185 mg/dL — ABNORMAL HIGH (ref 70–99)
Glucose-Capillary: 208 mg/dL — ABNORMAL HIGH (ref 70–99)
Glucose-Capillary: 189 mg/dL — ABNORMAL HIGH (ref 70–99)

## 2020-02-12 LAB — BASIC METABOLIC PANEL
Anion gap: 6 (ref 5–15)
BUN: 13 mg/dL (ref 6–20)
CO2: 27 mmol/L (ref 22–32)
Calcium: 8.1 mg/dL — ABNORMAL LOW (ref 8.9–10.3)
Chloride: 104 mmol/L (ref 98–111)
Creatinine, Ser: 0.37 mg/dL — ABNORMAL LOW (ref 0.44–1.00)
GFR calc Af Amer: >60 mL/min (ref >60)
GFR calc non Af Amer: >60 mL/min (ref >60)
Glucose, Bld: 232 mg/dL — ABNORMAL HIGH (ref 70–99)
Potassium: 3.5 mmol/L (ref 3.5–5.1)
Sodium: 137 mmol/L (ref 135–145)

## 2020-02-12 MED ORDER — INSULIN ASPART 100 UNIT/ML ~~LOC~~ SOLN
8.0000 [IU] | Freq: Three times a day (TID) | SUBCUTANEOUS | Status: DC
  Administered 2020-02-12 – 2020-02-13 (×4): 8 [IU] via SUBCUTANEOUS

## 2020-02-12 MED ORDER — PANTOPRAZOLE SODIUM 40 MG PO TBEC
40.0000 mg | DELAYED_RELEASE_TABLET | Freq: Every day | ORAL | Status: DC
  Administered 2020-02-12 – 2020-02-13 (×2): 40 mg via ORAL
  Filled 2020-02-12 (×2): qty 1

## 2020-02-12 NOTE — Progress Notes (Signed)
PROGRESS NOTE  [name]  IEP:[id number] DOB: 1995-11-25 DOA: 02/09/2020 PCP: Zoe Fox, No Pcp Per  Brief Narrative: Zoe Fox is a 24 y.o. female with a history of T1DM, recent diagnosis of covid-19 (7/18) s/p regeneron 7/21 and recent admission for DKA who returned to the ED with severe hyperglycemia, abdominal pain, nausea and vomiting found to be in DKA despite reporting adherence to insulin at home. IV insulin and IVF were given with improvement. IV insulin was transitioned to basal-bolus insulin the morning after admission with return of acidosis, increase in anion gap and measures ketones, associated with recurrent hyperglycemia. IV insulin was restarted with recurrent improvement. Lantus dose was increased with better glycemic control.  Assessment & Plan: Principal Problem:   DKA (diabetic ketoacidoses) (HCC) Active Problems:   Hypertension   Hyperkalemia   Hypercalcemia   Hypernatremia  DKA, poorly-controlled T1DM: HbA1c >15.5% indicative of AVERAGE blood sugar of nearly 400mg /dl. Covid-19 can certainly increase blood sugars itself, though CBGs were better controlled at time of discharge as well on home regimen. - Recurrent DKA after initially stopping IV insulin despite giving home lantus 30u. Given 50u subsequently with improvement, increase mealtime and continue SSI with HS coverage. Due to large departure from home dosing and rapid readmission in severe DKA, will continue inpatient management at least another 24 hours. - Diabetes coordinator consulted  Hypokalemia:  - Supplement again this AM, though is improving.   Covid-19 infection:  - Dx 7/18 and require isolation for 10 days (based on designation of mild/moderate disease).  - Stable s/p regeneron.   DVT prophylaxis: Lovenox Code Status: Full Family Communication: None at bedside Disposition Plan:  Status is: Inpatient; Transfer order to med-surg placed 7/26.  Remains inpatient appropriate  because:Persistent severe electrolyte disturbances   Dispo: The Zoe Fox is from: Home              Anticipated d/c is to: Home              Anticipated d/c date is: 1 day              Zoe Fox currently is not medically stable to d/c.  Consultants:   None  Procedures:   None  Antimicrobials:  None   Subjective: Having nausea, but eating well per RN. No other complaints.   Objective: Vitals:   02/12/20 0900 02/12/20 0924 02/12/20 1100 02/12/20 1200  BP: 124/74 124/74 (!) 141/75 (!) 107/61  Pulse: 71  70 78  Resp: 12  17 14   Temp:      TempSrc:      SpO2: 98%  100% 99%  Weight:       Gen: 24 y.o. female in no distress Pulm: Nonlabored breathing room air. Clear. CV: Regular rate and rhythm. No murmur, rub, or gallop. No JVD, no dependent edema. GI: Abdomen soft, non-tender, non-distended, with normoactive bowel sounds.  Ext: Warm, no deformities Skin: No rashes, lesions or ulcers on visualized skin. Neuro: Alert and oriented. No focal neurological deficits. Psych: Judgement and insight appear fair. Mood euthymic & affect congruent. Behavior is appropriate.    Data Reviewed: I have personally reviewed following labs and imaging studies  CBC: Recent Labs  Lab 02/09/20 2120 02/11/20 0936  WBC 9.4 7.9  NEUTROABS  --  3.2  HGB 13.4 10.1*  HCT 43.9 31.7*  MCV 84.4 81.3  PLT 370 295   Basic Metabolic Panel: Recent Labs  Lab 02/10/20 0918 02/10/20 1219 02/10/20 1900 02/11/20 0936 02/12/20 0824  NA  141 140 136 139 137  K 3.3* 4.0 3.2* 2.7* 3.5  CL 107 105 101 101 104  CO2 25 21* 23 26 27   GLUCOSE 252* 413* 215* 86 232*  BUN 15 14 14 12 13   CREATININE 0.58 0.54 0.71 0.39* 0.37*  CALCIUM 9.0 8.7* 8.7* 8.5* 8.1*   GFR: Estimated Creatinine Clearance: 100 mL/min (A) (by C-G formula based on SCr of 0.37 mg/dL (L)). Liver Function Tests: Recent Labs  Lab 02/09/20 2122  AST 29  ALT 33  ALKPHOS 105  BILITOT 1.4*  PROT 8.2*  ALBUMIN 4.0   CBG: Recent  Labs  Lab 02/11/20 1643 02/11/20 2149 02/12/20 0641 02/12/20 0841 02/12/20 1216  GLUCAP 336* 322* 241* 211* 185*   Urine analysis:    Component Value Date/Time   COLORURINE STRAW (A) 02/09/2020 2122   APPEARANCEUR CLEAR 02/09/2020 2122   LABSPEC 1.028 02/09/2020 2122   PHURINE 5.0 02/09/2020 2122   GLUCOSEU >=500 (A) 02/09/2020 2122   HGBUR NEGATIVE 02/09/2020 2122   BILIRUBINUR NEGATIVE 02/09/2020 2122   KETONESUR 80 (A) 02/09/2020 2122   PROTEINUR NEGATIVE 02/09/2020 2122   NITRITE NEGATIVE 02/09/2020 2122   LEUKOCYTESUR NEGATIVE 02/09/2020 2122   Recent Results (from the past 240 hour(s))  SARS Coronavirus 2 by RT PCR (hospital order, performed in [facility] hospital lab) Nasopharyngeal Nasopharyngeal Swab     Status: Abnormal   Collection Time: 02/03/20 10:21 AM   Specimen: Nasopharyngeal Swab  Result Value Ref Range Status   SARS Coronavirus 2 POSITIVE (A) NEGATIVE Final    Comment: RESULT CALLED TO, READ BACK BY AND VERIFIED WITH: JOSEPH,M RN @1305  ON 02/03/20 JACKSON,K (NOTE) SARS-CoV-2 target nucleic acids are DETECTED  SARS-CoV-2 RNA is generally detectable in upper respiratory specimens  during the acute phase of infection.  Positive results are indicative  of the presence of the identified virus, but do not rule out bacterial infection or co-infection with other pathogens not detected by the test.  Clinical correlation with Zoe Fox history and  other diagnostic information is necessary to determine Zoe Fox infection status.  The expected result is negative.  Fact Sheet for Patients:   02/05/20   Fact Sheet for Healthcare Providers:      This test is not yet approved or cleared by the 02/05/20 FDA and  has been authorized for detection and/or diagnosis of SARS-CoV-2 by FDA under an Emergency Use Authorization (EUA).  This EUA will remain in effect (meaning thi s test can be  used) for the duration of  the COVID-19 declaration under Section 564(b)(1) of the Act, 21 U.S.C. section 360-bbb-3(b)(1), unless the authorization is terminated or revoked sooner.  Performed at [facility], [address], [place], [place]   MRSA PCR Screening     Status: None   Collection Time: 02/03/20  2:23 PM   Specimen: Nasal Mucosa; Nasopharyngeal  Result Value Ref Range Status   MRSA by PCR NEGATIVE NEGATIVE Final    Comment:        The GeneXpert MRSA Assay (FDA approved for NASAL specimens only), is one component of a comprehensive MRSA colonization surveillance program. It is not intended to diagnose MRSA infection nor to guide or monitor treatment for MRSA infections. Performed at [facility], [address], [place], [place]       Radiology Studies: No results found.  Scheduled Meds: . Chlorhexidine Gluconate Cloth  6 each Topical Daily  . enalapril  10 mg Oral Daily  .  enoxaparin (LOVENOX) injection  40 mg Subcutaneous Q24H  . insulin aspart  0-5 Units Subcutaneous QHS  . insulin aspart  0-9 Units Subcutaneous TID WC  . insulin aspart  8 Units Subcutaneous TID WC  . insulin glargine  50 Units Subcutaneous Daily  . pantoprazole  40 mg Oral Daily   Continuous Infusions: . sodium chloride 75 mL/hr at 02/12/20 0124     LOS: 2 days   Time spent: 25 minutes.  Tyrone Nine, MD [facility] [email or web address] 02/12/2020, 1:05 PM

## 2020-02-12 NOTE — Progress Notes (Signed)
PHARMACIST - PHYSICIAN COMMUNICATION  DR: Jarvis Newcomer  CONCERNING: IV to Oral Route Change Policy  RECOMMENDATION: This patient is receiving Pantoprazole by the intravenous route.  Based on criteria approved by the Pharmacy and Therapeutics Committee, the intravenous medication(s) is/are being converted to the equivalent oral dose form(s).   DESCRIPTION: These criteria include:  The patient is eating (either orally or via tube) and/or has been taking other orally administered medications for a least 24 hours  The patient has no evidence of active gastrointestinal bleeding or impaired GI absorption (gastrectomy, short bowel, patient on TNA or NPO).  If you have questions about this conversion, please contact the Pharmacy Department  []   [phone] )  ( [phone] []   [phone] )  [facility] []   [phone] )  [facility] []   [phone] )  [facility] [x]   [phone] )  [facility]   ( [phone] [place], [facility] 02/12/2020 7:52 AM

## 2020-02-13 ENCOUNTER — Other Ambulatory Visit: Payer: Self-pay

## 2020-02-13 DIAGNOSIS — E1010 Type 1 diabetes mellitus with ketoacidosis without coma: Secondary | ICD-10-CM | POA: Diagnosis not present

## 2020-02-13 DIAGNOSIS — I10 Essential (primary) hypertension: Secondary | ICD-10-CM | POA: Diagnosis not present

## 2020-02-13 LAB — BASIC METABOLIC PANEL
Anion gap: 11 (ref 5–15)
BUN: 18 mg/dL (ref 6–20)
CO2: 24 mmol/L (ref 22–32)
Calcium: 8.3 mg/dL — ABNORMAL LOW (ref 8.9–10.3)
Chloride: 104 mmol/L (ref 98–111)
Creatinine, Ser: 0.7 mg/dL (ref 0.44–1.00)
GFR calc Af Amer: >60 mL/min (ref >60)
GFR calc non Af Amer: >60 mL/min (ref >60)
Glucose, Bld: 305 mg/dL — ABNORMAL HIGH (ref 70–99)
Potassium: 4.2 mmol/L (ref 3.5–5.1)
Sodium: 139 mmol/L (ref 135–145)

## 2020-02-13 LAB — GLUCOSE, CAPILLARY
Glucose-Capillary: 305 mg/dL — ABNORMAL HIGH (ref 70–99)
Glucose-Capillary: 128 mg/dL — ABNORMAL HIGH (ref 70–99)

## 2020-02-13 MED ORDER — ONDANSETRON HCL 4 MG PO TABS: 4.0000 mg | ORAL_TABLET | Freq: Four times a day (QID) | ORAL | 0 refills | Status: AC | PRN

## 2020-02-13 MED ORDER — BLOOD GLUCOSE MONITOR KIT: PACK | 0 refills | Status: DC

## 2020-02-13 MED ORDER — INSULIN GLARGINE 100 UNIT/ML SOLOSTAR PEN
56.0000 [IU] | PEN_INJECTOR | Freq: Every day | SUBCUTANEOUS | 11 refills | Status: AC
Start: 2020-02-13 — End: ?

## 2020-02-13 MED ORDER — PEN NEEDLES 31G X 5 MM MISC: 1.0000 | Freq: Three times a day (TID) | 0 refills | Status: DC

## 2020-02-13 MED ORDER — ENALAPRIL MALEATE 10 MG PO TABS: 10.0000 mg | ORAL_TABLET | Freq: Every day | ORAL | 0 refills | Status: AC

## 2020-02-13 MED ORDER — PEN NEEDLES 31G X 5 MM MISC: 1.0000 | Freq: Three times a day (TID) | 0 refills | Status: AC

## 2020-02-13 MED ORDER — PANTOPRAZOLE SODIUM 40 MG PO TBEC: 40.0000 mg | DELAYED_RELEASE_TABLET | Freq: Every day | ORAL | 0 refills | Status: AC

## 2020-02-13 MED ORDER — INSULIN GLARGINE 100 UNIT/ML SOLOSTAR PEN
56.0000 [IU] | PEN_INJECTOR | Freq: Every day | SUBCUTANEOUS | 11 refills | Status: DC
Start: 2020-02-13 — End: 2020-02-13

## 2020-02-13 MED ORDER — INSULIN ASPART 100 UNIT/ML ~~LOC~~ SOLN
12.0000 [IU] | Freq: Three times a day (TID) | SUBCUTANEOUS | Status: DC
  Administered 2020-02-13: 12 [IU] via SUBCUTANEOUS

## 2020-02-13 MED ORDER — PANTOPRAZOLE SODIUM 40 MG PO TBEC: 40.0000 mg | DELAYED_RELEASE_TABLET | Freq: Every day | ORAL | 0 refills | Status: DC

## 2020-02-13 MED ORDER — INSULIN GLARGINE 100 UNIT/ML ~~LOC~~ SOLN
56.0000 [IU] | Freq: Every day | SUBCUTANEOUS | Status: DC
  Administered 2020-02-13: 56 [IU] via SUBCUTANEOUS
  Filled 2020-02-13: qty 0.56

## 2020-02-13 MED ORDER — ONDANSETRON HCL 4 MG PO TABS: 4.0000 mg | ORAL_TABLET | Freq: Four times a day (QID) | ORAL | 0 refills | Status: DC | PRN

## 2020-02-13 MED ORDER — BLOOD GLUCOSE MONITOR KIT: PACK | 0 refills | Status: AC

## 2020-02-13 NOTE — Plan of Care (Signed)
  Problem: Cardiac: Goal: Ability to maintain an adequate cardiac output will improve Outcome: Completed/Met   Problem: Health Behavior/Discharge Planning: Goal: Ability to identify and utilize available resources and services will improve Outcome: Completed/Met Goal: Ability to manage health-related needs will improve Outcome: Completed/Met   Problem: Fluid Volume: Goal: Ability to achieve a balanced intake and output will improve Outcome: Completed/Met   Problem: Metabolic: Goal: Ability to maintain appropriate glucose levels will improve Outcome: Completed/Met   Problem: Nutritional: Goal: Maintenance of adequate nutrition will improve Outcome: Completed/Met Goal: Maintenance of adequate weight for body size and type will improve Outcome: Completed/Met   Problem: Respiratory: Goal: Will regain and/or maintain adequate ventilation Outcome: Completed/Met   Problem: Urinary Elimination: Goal: Ability to achieve and maintain adequate renal perfusion and functioning will improve Outcome: Completed/Met   Problem: Education: Goal: Knowledge of General Education information will improve Description: Including pain rating scale, medication(s)/side effects and non-pharmacologic comfort measures Outcome: Completed/Met   Problem: Health Behavior/Discharge Planning: Goal: Ability to manage health-related needs will improve Outcome: Completed/Met   Problem: Clinical Measurements: Goal: Ability to maintain clinical measurements within normal limits will improve Outcome: Completed/Met Goal: Will remain free from infection Outcome: Completed/Met Goal: Diagnostic test results will improve Outcome: Completed/Met Goal: Respiratory complications will improve Outcome: Completed/Met Goal: Cardiovascular complication will be avoided Outcome: Completed/Met   Problem: Activity: Goal: Risk for activity intolerance will decrease Outcome: Completed/Met   Problem: Nutrition: Goal:  Adequate nutrition will be maintained Outcome: Completed/Met   Problem: Coping: Goal: Level of anxiety will decrease Outcome: Completed/Met   Problem: Elimination: Goal: Will not experience complications related to bowel motility Outcome: Completed/Met Goal: Will not experience complications related to urinary retention Outcome: Completed/Met   Problem: Pain Managment: Goal: General experience of comfort will improve Outcome: Completed/Met   Problem: Safety: Goal: Ability to remain free from injury will improve Outcome: Completed/Met   Problem: Skin Integrity: Goal: Risk for impaired skin integrity will decrease Outcome: Completed/Met

## 2020-02-13 NOTE — Progress Notes (Signed)
Pt discharged home today per Dr. Marland Mcalpine. Pt's IV site D/C'd and WDL. Pt's VSS. Pt provided with home medication list, discharge instructions and prescriptions. Verbalized understanding. Pt left floor via WC in stable condition accompanied by RN and NT.

## 2020-02-13 NOTE — Progress Notes (Signed)
Inpatient Diabetes Program Recommendations  AACE/ADA: New Consensus Statement on Inpatient Glycemic Control (2015)  Target Ranges:  Prepandial:   less than 140 mg/dL      Peak postprandial:   less than 180 mg/dL (1-2 hours)      Critically ill patients:  140 - 180 mg/dL   Lab Results  Component Value Date   GLUCAP 189 (H) 02/12/2020   HGBA1C >15.5 (H) 02/04/2020    Review of Glycemic Control  Current orders for Inpatient glycemic control: Lantus 50 units QD, Novolog 0-9 units tidwc and hs + 8 units tidwc  FBS - 305 mg/dL this am. Post-prandials > 180 mg/dL on 0/35.  Needs insulin titration.  Inpatient Diabetes Program Recommendations:     Increase Lantus to 56 units QD Increase Novolog to 12 units tidwc for meal coverage  Stress importance of f/u with Endo after discharge.  Continue to follow.  Thank you. Ailene Ards, RD, LDN, CDE Inpatient Diabetes Coordinator [phone]

## 2020-02-13 NOTE — Discharge Summary (Signed)
Physician Discharge Summary  Zoe Fox HYQ:[id number] DOB: 01-02-1996 DOA: 02/09/2020  PCP: Patient, No Pcp Per  Admit date: 02/09/2020 Discharge date: 02/13/2020  Admitted From: Home Disposition: Home  Recommendations for Outpatient Follow-up:  1. Follow up with PCP in 1-2 weeks 2. Follow up with Enodcrinology within 1-2 weeks 3. Please obtain CMP/CBC, Mag, Phos in one week 4. Please follow up on the following pending results:  Home Health: No Equipment/Devices: None  Discharge Condition: Stable  CODE STATUS: FULL CODE Diet recommendation: Heart Healthy Carb Modified Diet   Brief/Interim Summary: The patient is a 24 year old African-American female with a past medical history significant for type 1 diabetes mellitus and recently diagnosed with COVID-19 disease on 02/03/2020 status post Regeneron Covid antibodies on 02/06/2020 and had a recent admission for DKA who returned to the ED with severe hyperglycemia, abdominal pain, nausea and vomiting and found to be in DKA again despite reporting adherence to insulin at home.  She was given IV insulin and IV fluids were started and she had improved.  Initially she was transitioned off of IV insulin to basal bolus insulin however she had return of acidosis and increased anion gap and measured of ketones associated with her current hyperglycemia.  Subsequently IV insulin was restarted with recurrent improvement.  She was then subsequently transitioned to Lantus with better glycemic control and Lantus doses has been adjusted.  She is now on 56 units of Lantus and NovoLog sliding scale.  She subsequently improved and is stable to be discharged at this time and she will need to follow-up with PCP and endocrinology and has an endocrinology appointment in [place] in the near future.  She is asymptomatic from her Covid disease and she is stable to be discharged home at this time.  Discharge Diagnoses:  Principal Problem:   DKA (diabetic  ketoacidoses) ([place]) Active Problems:   Hypertension   Hyperkalemia   Hypercalcemia   Hypernatremia  DKA, poorly-controlled T1DM:  -HbA1c >15.5% indicative of AVERAGE blood sugar of nearly 450m/dl.  -[id number] certainly increase blood sugars itself, though CBGs were better controlled at time of discharge as well on home regimen. - Recurrent DKA after initially stopping IV insulin despite giving home lantus 30u. Given 50u and increased to 56 un subsequently with improvement, increase mealtime and continue SSI with HS coverage.  -Diabetes coordinator consulted and appreciate Rec's -CBGs ranging from 128-305 -Follow up with Endocrinology at D/C  Hypokalemia:  -Improved to 4.2   Covid-19 Infection:  - Dx 7/18 and require isolation for 10 days (based on designation of mild/moderate disease).  - Stable s/p regeneron Antibodies.   HTN -C/w Enalapril   Discharge Instructions  Discharge Instructions    Ambulatory referral to Nutrition and Diabetic Education   Complete by: As directed    Call MD for:  difficulty breathing, headache or visual disturbances   Complete by: As directed    Call MD for:  extreme fatigue   Complete by: As directed    Call MD for:  hives   Complete by: As directed    Call MD for:  persistant dizziness or light-headedness   Complete by: As directed    Call MD for:  persistant nausea and vomiting   Complete by: As directed    Call MD for:  redness, tenderness, or signs of infection (pain, swelling, redness, odor or green/yellow discharge around incision site)   Complete by: As directed    Call MD for:  severe uncontrolled pain   Complete  by: As directed    Call MD for:  temperature >100.4   Complete by: As directed    Diet - low sodium heart healthy   Complete by: As directed    Diet Carb Modified   Complete by: As directed    Discharge instructions   Complete by: As directed    You were cared for by a hospitalist during your hospital stay. If you  have any questions about your discharge medications or the care you received while you were in the hospital after you are discharged, you can call the unit and ask to speak with the hospitalist on call if the hospitalist that took care of you is not available. Once you are discharged, your primary care physician will handle any further medical issues. Please note that NO REFILLS for any discharge medications will be authorized once you are discharged, as it is imperative that you return to your primary care physician (or establish a relationship with a primary care physician if you do not have one) for your aftercare needs so that they can reassess your need for medications and monitor your lab values.  Follow up with PCP and Endocrinology in the outpatient setting. Take all medications as prescribed. If symptoms change or worsen please return to the ED for evaluation   Increase activity slowly   Complete by: As directed      Allergies as of 02/13/2020   No Known Allergies     Medication List    STOP taking these medications   insulin glargine 100 UNIT/ML injection Commonly known as: LANTUS Replaced by: insulin glargine 100 UNIT/ML Solostar Pen   nitrofurantoin (macrocrystal-monohydrate) 100 MG capsule Commonly known as: MACROBID     TAKE these medications   acetaminophen 325 MG tablet Commonly known as: TYLENOL Take 650 mg by mouth every 6 (six) hours as needed for mild pain or headache.   ascorbic acid 500 MG tablet Commonly known as: VITAMIN C Take 1 tablet (500 mg total) by mouth daily.   blood glucose meter kit and supplies Kit Dispense based on patient and insurance preference. Use up to four times daily as directed. (FOR ICD-9 250.00, 250.01).   enalapril 10 MG tablet Commonly known as: VASOTEC Take 10 mg by mouth daily.   fluticasone 50 MCG/ACT nasal spray Commonly known as: FLONASE Place 2 sprays into both nostrils daily for 10 days.   insulin glargine 100 UNIT/ML  Solostar Pen Commonly known as: LANTUS Inject 56 Units into the skin daily. Replaces: insulin glargine 100 UNIT/ML injection   NovoLOG FlexPen 100 UNIT/ML FlexPen Generic drug: insulin aspart Inject 5-15 Units into the skin 3 (three) times daily with meals.   ondansetron 4 MG tablet Commonly known as: ZOFRAN Take 1 tablet (4 mg total) by mouth every 6 (six) hours as needed for nausea.   pantoprazole 40 MG tablet Commonly known as: PROTONIX Take 1 tablet (40 mg total) by mouth daily. Start taking on: February 14, 2020   Pen Needles 31G X 5 MM Misc 1 Container by Does not apply route 4 (four) times daily -  before meals and at bedtime.       No Known Allergies  Consultations:  Diabetes Education Coordinator    Procedures/Studies: DG CHEST PORT 1 VIEW  Result Date: 02/03/2020 CLINICAL DATA:  Nausea, vomiting. EXAM: PORTABLE CHEST 1 VIEW COMPARISON:  None. FINDINGS: The heart size and mediastinal contours are within normal limits. Both lungs are clear. The visualized skeletal structures are unremarkable. IMPRESSION:  No active disease. Electronically Signed   By: Marijo Conception M.D.   On: 02/03/2020 14:30     Subjective: Seen and examined at bedside and she is doing much better.  Wanting to rest.  No chest pain, lightheadedness or dizziness.  No nausea or vomiting.  No other concerns or complaints at this time is stable to be discharged home and follow-up with PCP as well as Endocrinology in outpatient setting  Discharge Exam: Vitals:   02/13/20 0651 02/13/20 1008  BP: 100/69 (!) 124/86  Pulse: 79   Resp: 20   Temp: 98.1 F (36.7 C)   SpO2: 99%    Vitals:   02/12/20 2200 02/12/20 2322 02/13/20 0651 02/13/20 1008  BP: (!) 92/46 110/72 100/69 (!) 124/86  Pulse: 90 87 79   Resp: _0 Temp:  98.2 F (36.8 C) 98.1 F (36.7 C)   TempSrc:  Oral Oral   SpO2: 100% 100% 99%   Weight:  69.8 kg    Height:  _1  (1.575 m)     General: Pt is alert, awake, not in acute  distress Cardiovascular: RRR, S1/S2 +, no rubs, no gallops Respiratory: CTA bilaterally, no wheezing, no rhonchi Abdominal: Soft, NT, slightly distended secondary body habitus, bowel sounds + Extremities: no edema, no cyanosis  The results of significant diagnostics from this hospitalization (including imaging, microbiology, ancillary and laboratory) are listed below for reference.     Microbiology: Recent Results (from the past 240 hour(s))  MRSA PCR Screening     Status: None   Collection Time: 02/03/20  2:23 PM   Specimen: Nasal Mucosa; Nasopharyngeal  Result Value Ref Range Status   MRSA by PCR NEGATIVE NEGATIVE Final    Comment:        The GeneXpert MRSA Assay (FDA approved for NASAL specimens only), is one component of a comprehensive MRSA colonization surveillance program. It is not intended to diagnose MRSA infection nor to guide or monitor treatment for MRSA infections. Performed at [facility], [place] [address], [place], [place] [address]      Labs: BNP (last 3 results) No results for input(s): BNP in the last 8760 hours. Basic Metabolic Panel: Recent Labs  Lab 02/10/20 1219 02/10/20 1900 02/11/20 0936 02/12/20 0824 02/13/20 0421  NA 140 136 139 137 139  K 4.0 3.2* 2.7* 3.5 4.2  CL 105 101 101 104 104  CO2 21* _2 GLUCOSE 413* 215* 86 232* 305*  BUN _3 CREATININE 0.54 0.71 0.39* 0.37* 0.70  CALCIUM 8.7* 8.7* 8.5* 8.1* 8.3*   Liver Function Tests: Recent Labs  Lab 02/09/20 2122  AST 29  ALT 33  ALKPHOS 105  BILITOT 1.4*  PROT 8.2*  ALBUMIN 4.0   No results for input(s): LIPASE, AMYLASE in the last 168 hours. No results for input(s): AMMONIA in the last 168 hours. CBC: Recent Labs  Lab 02/09/20 2120 02/11/20 0936  WBC 9.4 7.9  NEUTROABS  --  3.2  HGB 13.4 10.1*  HCT 43.9 31.7*  MCV 84.4 81.3  PLT 370 295   Cardiac Enzymes: No results for input(s): CKTOTAL, CKMB, CKMBINDEX, TROPONINI in the last  168 hours. BNP: Invalid input(s): POCBNP CBG: Recent Labs  Lab 02/12/20 0841 02/12/20 1216 02/12/20 1728 02/12/20 2136 02/13/20 0953  GLUCAP 211* 185* 208* 189* 305*   D-Dimer No results for input(s): DDIMER in the last 72 hours. Hgb A1c No results for input(s):  HGBA1C in the last 72 hours. Lipid Profile No results for input(s): CHOL, HDL, LDLCALC, TRIG, CHOLHDL, LDLDIRECT in the last 72 hours. Thyroid function studies No results for input(s): TSH, T4TOTAL, T3FREE, THYROIDAB in the last 72 hours.  Invalid input(s): FREET3 Anemia work up No results for input(s): VITAMINB12, FOLATE, FERRITIN, TIBC, IRON, RETICCTPCT in the last 72 hours. Urinalysis    Component Value Date/Time   COLORURINE STRAW (A) 02/09/2020 2122   APPEARANCEUR CLEAR 02/09/2020 2122   LABSPEC 1.028 02/09/2020 2122   PHURINE 5.0 02/09/2020 2122   GLUCOSEU >=500 (A) 02/09/2020 2122   HGBUR NEGATIVE 02/09/2020 2122   BILIRUBINUR NEGATIVE 02/09/2020 2122   KETONESUR 80 (A) 02/09/2020 2122   PROTEINUR NEGATIVE 02/09/2020 2122   NITRITE NEGATIVE 02/09/2020 2122   LEUKOCYTESUR NEGATIVE 02/09/2020 2122   Sepsis Labs Invalid input(s): PROCALCITONIN,  WBC,  LACTICIDVEN Microbiology Recent Results (from the past 240 hour(s))  MRSA PCR Screening     Status: None   Collection Time: 02/03/20  2:23 PM   Specimen: Nasal Mucosa; Nasopharyngeal  Result Value Ref Range Status   MRSA by PCR NEGATIVE NEGATIVE Final    Comment:        The GeneXpert MRSA Assay (FDA approved for NASAL specimens only), is one component of a comprehensive MRSA colonization surveillance program. It is not intended to diagnose MRSA infection nor to guide or monitor treatment for MRSA infections. Performed at [facility], [place] [address], [place], [place] [address]    Time coordinating discharge: 35 minutes  SIGNED:  Kerney Elbe, DO [facility] 02/13/2020, 11:46 AM Pager is on [place]  If  7PM-7AM, please contact night-coverage [email or web address]

## 2020-04-22 DIAGNOSIS — E109 Type 1 diabetes mellitus without complications: Secondary | ICD-10-CM | POA: Diagnosis not present

## 2020-05-05 DIAGNOSIS — M62830 Muscle spasm of back: Secondary | ICD-10-CM | POA: Diagnosis not present

## 2020-05-05 DIAGNOSIS — Z3202 Encounter for pregnancy test, result negative: Secondary | ICD-10-CM | POA: Diagnosis not present

## 2020-05-05 DIAGNOSIS — E109 Type 1 diabetes mellitus without complications: Secondary | ICD-10-CM | POA: Diagnosis not present

## 2020-05-05 DIAGNOSIS — M549 Dorsalgia, unspecified: Secondary | ICD-10-CM | POA: Diagnosis not present

## 2020-05-05 DIAGNOSIS — R81 Glycosuria: Secondary | ICD-10-CM | POA: Diagnosis not present

## 2020-05-16 DIAGNOSIS — E538 Deficiency of other specified B group vitamins: Secondary | ICD-10-CM | POA: Diagnosis not present

## 2020-05-16 DIAGNOSIS — E1029 Type 1 diabetes mellitus with other diabetic kidney complication: Secondary | ICD-10-CM | POA: Diagnosis not present

## 2020-05-16 DIAGNOSIS — E559 Vitamin D deficiency, unspecified: Secondary | ICD-10-CM | POA: Diagnosis not present

## 2020-05-16 DIAGNOSIS — E1065 Type 1 diabetes mellitus with hyperglycemia: Secondary | ICD-10-CM | POA: Diagnosis not present

## 2020-05-16 DIAGNOSIS — E785 Hyperlipidemia, unspecified: Secondary | ICD-10-CM | POA: Diagnosis not present

## 2020-05-30 DIAGNOSIS — Z20822 Contact with and (suspected) exposure to covid-19: Secondary | ICD-10-CM | POA: Diagnosis not present

## 2020-05-30 DIAGNOSIS — R631 Polydipsia: Secondary | ICD-10-CM | POA: Diagnosis not present

## 2020-05-30 DIAGNOSIS — E1040 Type 1 diabetes mellitus with diabetic neuropathy, unspecified: Secondary | ICD-10-CM | POA: Diagnosis not present

## 2020-05-30 DIAGNOSIS — Z79899 Other long term (current) drug therapy: Secondary | ICD-10-CM | POA: Diagnosis not present

## 2020-05-30 DIAGNOSIS — E860 Dehydration: Secondary | ICD-10-CM | POA: Diagnosis not present

## 2020-05-30 DIAGNOSIS — E1140 Type 2 diabetes mellitus with diabetic neuropathy, unspecified: Secondary | ICD-10-CM | POA: Diagnosis not present

## 2020-05-30 DIAGNOSIS — R109 Unspecified abdominal pain: Secondary | ICD-10-CM | POA: Diagnosis not present

## 2020-05-30 DIAGNOSIS — E1065 Type 1 diabetes mellitus with hyperglycemia: Secondary | ICD-10-CM | POA: Diagnosis not present

## 2020-05-30 DIAGNOSIS — M791 Myalgia, unspecified site: Secondary | ICD-10-CM | POA: Diagnosis not present

## 2020-06-02 DIAGNOSIS — E109 Type 1 diabetes mellitus without complications: Secondary | ICD-10-CM | POA: Diagnosis not present

## 2020-06-02 DIAGNOSIS — Z794 Long term (current) use of insulin: Secondary | ICD-10-CM | POA: Diagnosis not present

## 2020-06-05 DIAGNOSIS — E109 Type 1 diabetes mellitus without complications: Secondary | ICD-10-CM | POA: Diagnosis not present

## 2020-06-05 DIAGNOSIS — Z794 Long term (current) use of insulin: Secondary | ICD-10-CM | POA: Diagnosis not present

## 2020-06-06 DIAGNOSIS — E1042 Type 1 diabetes mellitus with diabetic polyneuropathy: Secondary | ICD-10-CM | POA: Diagnosis not present

## 2020-06-06 DIAGNOSIS — Z79899 Other long term (current) drug therapy: Secondary | ICD-10-CM | POA: Diagnosis not present

## 2020-06-06 DIAGNOSIS — I10 Essential (primary) hypertension: Secondary | ICD-10-CM | POA: Diagnosis not present

## 2020-06-06 DIAGNOSIS — R52 Pain, unspecified: Secondary | ICD-10-CM | POA: Diagnosis not present

## 2020-06-06 DIAGNOSIS — Z794 Long term (current) use of insulin: Secondary | ICD-10-CM | POA: Diagnosis not present

## 2020-06-13 DIAGNOSIS — E1065 Type 1 diabetes mellitus with hyperglycemia: Secondary | ICD-10-CM | POA: Diagnosis not present

## 2020-06-15 DIAGNOSIS — F430 Acute stress reaction: Secondary | ICD-10-CM | POA: Diagnosis not present

## 2020-06-15 DIAGNOSIS — E109 Type 1 diabetes mellitus without complications: Secondary | ICD-10-CM | POA: Diagnosis not present

## 2020-06-15 DIAGNOSIS — I10 Essential (primary) hypertension: Secondary | ICD-10-CM | POA: Diagnosis not present

## 2020-06-15 DIAGNOSIS — M62838 Other muscle spasm: Secondary | ICD-10-CM | POA: Diagnosis not present

## 2020-06-15 DIAGNOSIS — M545 Low back pain, unspecified: Secondary | ICD-10-CM | POA: Diagnosis not present

## 2020-06-15 DIAGNOSIS — M62830 Muscle spasm of back: Secondary | ICD-10-CM | POA: Diagnosis not present

## 2020-06-15 DIAGNOSIS — Z79899 Other long term (current) drug therapy: Secondary | ICD-10-CM | POA: Diagnosis not present

## 2020-06-15 DIAGNOSIS — Z8616 Personal history of COVID-19: Secondary | ICD-10-CM | POA: Diagnosis not present

## 2020-06-15 DIAGNOSIS — F439 Reaction to severe stress, unspecified: Secondary | ICD-10-CM | POA: Diagnosis not present

## 2020-06-15 DIAGNOSIS — Z794 Long term (current) use of insulin: Secondary | ICD-10-CM | POA: Diagnosis not present

## 2020-06-16 DIAGNOSIS — Z794 Long term (current) use of insulin: Secondary | ICD-10-CM | POA: Diagnosis not present

## 2020-06-18 DIAGNOSIS — M791 Myalgia, unspecified site: Secondary | ICD-10-CM | POA: Diagnosis not present

## 2020-06-18 DIAGNOSIS — R208 Other disturbances of skin sensation: Secondary | ICD-10-CM | POA: Diagnosis not present

## 2020-06-23 DIAGNOSIS — R202 Paresthesia of skin: Secondary | ICD-10-CM | POA: Diagnosis not present

## 2020-06-24 DIAGNOSIS — G8929 Other chronic pain: Secondary | ICD-10-CM | POA: Diagnosis not present

## 2020-06-24 DIAGNOSIS — Z794 Long term (current) use of insulin: Secondary | ICD-10-CM | POA: Diagnosis not present

## 2020-06-24 DIAGNOSIS — E119 Type 2 diabetes mellitus without complications: Secondary | ICD-10-CM | POA: Diagnosis not present

## 2020-06-24 DIAGNOSIS — I10 Essential (primary) hypertension: Secondary | ICD-10-CM | POA: Diagnosis not present

## 2020-06-24 DIAGNOSIS — Z79899 Other long term (current) drug therapy: Secondary | ICD-10-CM | POA: Diagnosis not present

## 2020-06-24 DIAGNOSIS — M549 Dorsalgia, unspecified: Secondary | ICD-10-CM | POA: Diagnosis not present

## 2020-06-24 DIAGNOSIS — M792 Neuralgia and neuritis, unspecified: Secondary | ICD-10-CM | POA: Diagnosis not present

## 2020-06-24 DIAGNOSIS — M79604 Pain in right leg: Secondary | ICD-10-CM | POA: Diagnosis not present

## 2020-06-24 DIAGNOSIS — M79605 Pain in left leg: Secondary | ICD-10-CM | POA: Diagnosis not present

## 2020-06-24 DIAGNOSIS — M545 Low back pain, unspecified: Secondary | ICD-10-CM | POA: Diagnosis not present

## 2020-06-25 DIAGNOSIS — M4317 Spondylolisthesis, lumbosacral region: Secondary | ICD-10-CM | POA: Diagnosis not present

## 2020-06-25 DIAGNOSIS — R200 Anesthesia of skin: Secondary | ICD-10-CM | POA: Diagnosis not present

## 2020-06-25 DIAGNOSIS — M48061 Spinal stenosis, lumbar region without neurogenic claudication: Secondary | ICD-10-CM | POA: Diagnosis not present

## 2020-06-25 DIAGNOSIS — M5126 Other intervertebral disc displacement, lumbar region: Secondary | ICD-10-CM | POA: Diagnosis not present

## 2020-06-25 DIAGNOSIS — R208 Other disturbances of skin sensation: Secondary | ICD-10-CM | POA: Diagnosis not present

## 2020-06-25 DIAGNOSIS — R202 Paresthesia of skin: Secondary | ICD-10-CM | POA: Diagnosis not present

## 2020-06-25 DIAGNOSIS — M5136 Other intervertebral disc degeneration, lumbar region: Secondary | ICD-10-CM | POA: Diagnosis not present

## 2020-06-25 DIAGNOSIS — R531 Weakness: Secondary | ICD-10-CM | POA: Diagnosis not present

## 2020-06-29 DIAGNOSIS — Z8616 Personal history of COVID-19: Secondary | ICD-10-CM | POA: Diagnosis not present

## 2020-06-29 DIAGNOSIS — E109 Type 1 diabetes mellitus without complications: Secondary | ICD-10-CM | POA: Diagnosis not present

## 2020-06-29 DIAGNOSIS — I10 Essential (primary) hypertension: Secondary | ICD-10-CM | POA: Diagnosis not present

## 2020-06-29 DIAGNOSIS — M5441 Lumbago with sciatica, right side: Secondary | ICD-10-CM | POA: Diagnosis not present

## 2020-06-29 DIAGNOSIS — M549 Dorsalgia, unspecified: Secondary | ICD-10-CM | POA: Diagnosis not present

## 2020-06-29 DIAGNOSIS — M5442 Lumbago with sciatica, left side: Secondary | ICD-10-CM | POA: Diagnosis not present

## 2020-06-29 DIAGNOSIS — M5186 Other intervertebral disc disorders, lumbar region: Secondary | ICD-10-CM | POA: Diagnosis not present

## 2020-07-03 DIAGNOSIS — M9903 Segmental and somatic dysfunction of lumbar region: Secondary | ICD-10-CM | POA: Diagnosis not present

## 2020-07-03 DIAGNOSIS — M5442 Lumbago with sciatica, left side: Secondary | ICD-10-CM | POA: Diagnosis not present

## 2020-07-03 DIAGNOSIS — M5441 Lumbago with sciatica, right side: Secondary | ICD-10-CM | POA: Diagnosis not present

## 2020-07-03 DIAGNOSIS — M9904 Segmental and somatic dysfunction of sacral region: Secondary | ICD-10-CM | POA: Diagnosis not present

## 2020-07-07 DIAGNOSIS — M5416 Radiculopathy, lumbar region: Secondary | ICD-10-CM | POA: Diagnosis not present

## 2020-07-07 DIAGNOSIS — M5459 Other low back pain: Secondary | ICD-10-CM | POA: Diagnosis not present

## 2020-07-07 DIAGNOSIS — M4316 Spondylolisthesis, lumbar region: Secondary | ICD-10-CM | POA: Diagnosis not present

## 2020-07-07 DIAGNOSIS — M4306 Spondylolysis, lumbar region: Secondary | ICD-10-CM | POA: Diagnosis not present

## 2020-07-08 DIAGNOSIS — I10 Essential (primary) hypertension: Secondary | ICD-10-CM | POA: Diagnosis not present

## 2020-07-08 DIAGNOSIS — M545 Low back pain, unspecified: Secondary | ICD-10-CM | POA: Diagnosis not present

## 2020-07-08 DIAGNOSIS — M79604 Pain in right leg: Secondary | ICD-10-CM | POA: Diagnosis not present

## 2020-07-08 DIAGNOSIS — Z794 Long term (current) use of insulin: Secondary | ICD-10-CM | POA: Diagnosis not present

## 2020-07-08 DIAGNOSIS — M79605 Pain in left leg: Secondary | ICD-10-CM | POA: Diagnosis not present

## 2020-07-08 DIAGNOSIS — M5126 Other intervertebral disc displacement, lumbar region: Secondary | ICD-10-CM | POA: Diagnosis not present

## 2020-07-08 DIAGNOSIS — Z79899 Other long term (current) drug therapy: Secondary | ICD-10-CM | POA: Diagnosis not present

## 2020-07-08 DIAGNOSIS — M5416 Radiculopathy, lumbar region: Secondary | ICD-10-CM | POA: Diagnosis not present

## 2020-07-08 DIAGNOSIS — E109 Type 1 diabetes mellitus without complications: Secondary | ICD-10-CM | POA: Diagnosis not present

## 2020-07-08 DIAGNOSIS — M5459 Other low back pain: Secondary | ICD-10-CM | POA: Diagnosis not present

## 2020-07-16 DIAGNOSIS — M5416 Radiculopathy, lumbar region: Secondary | ICD-10-CM | POA: Diagnosis not present

## 2020-07-16 DIAGNOSIS — M5136 Other intervertebral disc degeneration, lumbar region: Secondary | ICD-10-CM | POA: Diagnosis not present

## 2020-07-16 DIAGNOSIS — G894 Chronic pain syndrome: Secondary | ICD-10-CM | POA: Diagnosis not present

## 2020-07-16 DIAGNOSIS — M5459 Other low back pain: Secondary | ICD-10-CM | POA: Diagnosis not present

## 2020-07-24 DIAGNOSIS — M4316 Spondylolisthesis, lumbar region: Secondary | ICD-10-CM | POA: Diagnosis not present

## 2020-07-24 DIAGNOSIS — M5136 Other intervertebral disc degeneration, lumbar region: Secondary | ICD-10-CM | POA: Diagnosis not present

## 2020-07-24 DIAGNOSIS — M5416 Radiculopathy, lumbar region: Secondary | ICD-10-CM | POA: Diagnosis not present

## 2020-07-29 DIAGNOSIS — M47816 Spondylosis without myelopathy or radiculopathy, lumbar region: Secondary | ICD-10-CM | POA: Diagnosis not present

## 2020-07-29 DIAGNOSIS — M4316 Spondylolisthesis, lumbar region: Secondary | ICD-10-CM | POA: Diagnosis not present

## 2020-07-29 DIAGNOSIS — G894 Chronic pain syndrome: Secondary | ICD-10-CM | POA: Diagnosis not present

## 2020-07-29 DIAGNOSIS — M5416 Radiculopathy, lumbar region: Secondary | ICD-10-CM | POA: Diagnosis not present

## 2020-08-01 DIAGNOSIS — Z8616 Personal history of COVID-19: Secondary | ICD-10-CM | POA: Diagnosis not present

## 2020-08-01 DIAGNOSIS — Z79899 Other long term (current) drug therapy: Secondary | ICD-10-CM | POA: Diagnosis not present

## 2020-08-01 DIAGNOSIS — Z794 Long term (current) use of insulin: Secondary | ICD-10-CM | POA: Diagnosis not present

## 2020-08-01 DIAGNOSIS — M545 Low back pain, unspecified: Secondary | ICD-10-CM | POA: Diagnosis not present

## 2020-08-01 DIAGNOSIS — E109 Type 1 diabetes mellitus without complications: Secondary | ICD-10-CM | POA: Diagnosis not present

## 2020-08-01 DIAGNOSIS — M549 Dorsalgia, unspecified: Secondary | ICD-10-CM | POA: Diagnosis not present

## 2020-08-01 DIAGNOSIS — R52 Pain, unspecified: Secondary | ICD-10-CM | POA: Diagnosis not present

## 2020-08-01 DIAGNOSIS — I10 Essential (primary) hypertension: Secondary | ICD-10-CM | POA: Diagnosis not present

## 2020-08-01 DIAGNOSIS — G8929 Other chronic pain: Secondary | ICD-10-CM | POA: Diagnosis not present

## 2020-08-06 DIAGNOSIS — M47816 Spondylosis without myelopathy or radiculopathy, lumbar region: Secondary | ICD-10-CM | POA: Diagnosis not present

## 2020-08-08 DIAGNOSIS — R208 Other disturbances of skin sensation: Secondary | ICD-10-CM | POA: Diagnosis not present

## 2020-08-08 DIAGNOSIS — M47816 Spondylosis without myelopathy or radiculopathy, lumbar region: Secondary | ICD-10-CM | POA: Diagnosis not present

## 2020-08-08 DIAGNOSIS — M5416 Radiculopathy, lumbar region: Secondary | ICD-10-CM | POA: Diagnosis not present

## 2020-08-08 DIAGNOSIS — M62830 Muscle spasm of back: Secondary | ICD-10-CM | POA: Diagnosis not present

## 2020-08-12 DIAGNOSIS — R112 Nausea with vomiting, unspecified: Secondary | ICD-10-CM | POA: Diagnosis not present

## 2020-08-12 DIAGNOSIS — I10 Essential (primary) hypertension: Secondary | ICD-10-CM | POA: Diagnosis not present

## 2020-08-12 DIAGNOSIS — Z862 Personal history of diseases of the blood and blood-forming organs and certain disorders involving the immune mechanism: Secondary | ICD-10-CM | POA: Diagnosis not present

## 2020-08-12 DIAGNOSIS — Z6828 Body mass index (BMI) 28.0-28.9, adult: Secondary | ICD-10-CM | POA: Diagnosis not present

## 2020-08-12 DIAGNOSIS — E119 Type 2 diabetes mellitus without complications: Secondary | ICD-10-CM | POA: Diagnosis not present

## 2020-08-12 DIAGNOSIS — R42 Dizziness and giddiness: Secondary | ICD-10-CM | POA: Diagnosis not present

## 2020-08-12 DIAGNOSIS — Z794 Long term (current) use of insulin: Secondary | ICD-10-CM | POA: Diagnosis not present

## 2020-08-13 DIAGNOSIS — M4316 Spondylolisthesis, lumbar region: Secondary | ICD-10-CM | POA: Diagnosis not present

## 2020-08-13 DIAGNOSIS — M5416 Radiculopathy, lumbar region: Secondary | ICD-10-CM | POA: Diagnosis not present

## 2020-08-21 DIAGNOSIS — M5416 Radiculopathy, lumbar region: Secondary | ICD-10-CM | POA: Diagnosis not present

## 2020-08-21 DIAGNOSIS — M4316 Spondylolisthesis, lumbar region: Secondary | ICD-10-CM | POA: Diagnosis not present

## 2020-08-22 DIAGNOSIS — M4316 Spondylolisthesis, lumbar region: Secondary | ICD-10-CM | POA: Diagnosis not present

## 2020-08-22 DIAGNOSIS — M5416 Radiculopathy, lumbar region: Secondary | ICD-10-CM | POA: Diagnosis not present

## 2020-08-22 DIAGNOSIS — M62830 Muscle spasm of back: Secondary | ICD-10-CM | POA: Diagnosis not present

## 2020-08-22 DIAGNOSIS — R208 Other disturbances of skin sensation: Secondary | ICD-10-CM | POA: Diagnosis not present

## 2020-08-28 DIAGNOSIS — G894 Chronic pain syndrome: Secondary | ICD-10-CM | POA: Diagnosis not present

## 2020-08-28 DIAGNOSIS — M25511 Pain in right shoulder: Secondary | ICD-10-CM | POA: Diagnosis not present

## 2020-08-28 DIAGNOSIS — M25512 Pain in left shoulder: Secondary | ICD-10-CM | POA: Diagnosis not present

## 2020-09-08 DIAGNOSIS — R202 Paresthesia of skin: Secondary | ICD-10-CM | POA: Diagnosis not present

## 2020-09-08 DIAGNOSIS — M5459 Other low back pain: Secondary | ICD-10-CM | POA: Diagnosis not present

## 2020-09-11 DIAGNOSIS — M4316 Spondylolisthesis, lumbar region: Secondary | ICD-10-CM | POA: Diagnosis not present

## 2020-09-11 DIAGNOSIS — M5416 Radiculopathy, lumbar region: Secondary | ICD-10-CM | POA: Diagnosis not present

## 2020-09-16 DIAGNOSIS — M47816 Spondylosis without myelopathy or radiculopathy, lumbar region: Secondary | ICD-10-CM | POA: Diagnosis not present

## 2020-09-18 DIAGNOSIS — M25511 Pain in right shoulder: Secondary | ICD-10-CM | POA: Diagnosis not present

## 2020-09-18 DIAGNOSIS — G894 Chronic pain syndrome: Secondary | ICD-10-CM | POA: Diagnosis not present

## 2020-09-18 DIAGNOSIS — M62830 Muscle spasm of back: Secondary | ICD-10-CM | POA: Diagnosis not present

## 2020-09-18 DIAGNOSIS — M25512 Pain in left shoulder: Secondary | ICD-10-CM | POA: Diagnosis not present

## 2020-09-19 DIAGNOSIS — Z0000 Encounter for general adult medical examination without abnormal findings: Secondary | ICD-10-CM | POA: Diagnosis not present

## 2020-09-29 DIAGNOSIS — Z23 Encounter for immunization: Secondary | ICD-10-CM | POA: Diagnosis not present

## 2020-09-29 DIAGNOSIS — E109 Type 1 diabetes mellitus without complications: Secondary | ICD-10-CM | POA: Diagnosis not present

## 2020-09-29 DIAGNOSIS — Z0000 Encounter for general adult medical examination without abnormal findings: Secondary | ICD-10-CM | POA: Diagnosis not present

## 2020-09-29 DIAGNOSIS — M519 Unspecified thoracic, thoracolumbar and lumbosacral intervertebral disc disorder: Secondary | ICD-10-CM | POA: Diagnosis not present

## 2020-09-29 DIAGNOSIS — I10 Essential (primary) hypertension: Secondary | ICD-10-CM | POA: Diagnosis not present

## 2020-10-07 DIAGNOSIS — M5416 Radiculopathy, lumbar region: Secondary | ICD-10-CM | POA: Diagnosis not present

## 2020-10-07 DIAGNOSIS — M546 Pain in thoracic spine: Secondary | ICD-10-CM | POA: Diagnosis not present

## 2020-10-07 DIAGNOSIS — G894 Chronic pain syndrome: Secondary | ICD-10-CM | POA: Diagnosis not present

## 2020-10-07 DIAGNOSIS — M62830 Muscle spasm of back: Secondary | ICD-10-CM | POA: Diagnosis not present

## 2020-10-10 DIAGNOSIS — M546 Pain in thoracic spine: Secondary | ICD-10-CM | POA: Diagnosis not present

## 2020-10-10 DIAGNOSIS — M4306 Spondylolysis, lumbar region: Secondary | ICD-10-CM | POA: Diagnosis not present

## 2020-10-10 DIAGNOSIS — M62830 Muscle spasm of back: Secondary | ICD-10-CM | POA: Diagnosis not present

## 2020-10-10 DIAGNOSIS — G894 Chronic pain syndrome: Secondary | ICD-10-CM | POA: Diagnosis not present

## 2020-10-17 DIAGNOSIS — M546 Pain in thoracic spine: Secondary | ICD-10-CM | POA: Diagnosis not present

## 2020-10-17 DIAGNOSIS — M4306 Spondylolysis, lumbar region: Secondary | ICD-10-CM | POA: Diagnosis not present

## 2020-10-17 DIAGNOSIS — M62830 Muscle spasm of back: Secondary | ICD-10-CM | POA: Diagnosis not present

## 2020-10-17 DIAGNOSIS — G894 Chronic pain syndrome: Secondary | ICD-10-CM | POA: Diagnosis not present

## 2020-10-22 DIAGNOSIS — M62830 Muscle spasm of back: Secondary | ICD-10-CM | POA: Diagnosis not present

## 2020-10-22 DIAGNOSIS — M4306 Spondylolysis, lumbar region: Secondary | ICD-10-CM | POA: Diagnosis not present

## 2020-10-22 DIAGNOSIS — M546 Pain in thoracic spine: Secondary | ICD-10-CM | POA: Diagnosis not present

## 2020-10-22 DIAGNOSIS — G894 Chronic pain syndrome: Secondary | ICD-10-CM | POA: Diagnosis not present

## 2020-10-23 DIAGNOSIS — M5416 Radiculopathy, lumbar region: Secondary | ICD-10-CM | POA: Diagnosis not present

## 2020-10-23 DIAGNOSIS — M4316 Spondylolisthesis, lumbar region: Secondary | ICD-10-CM | POA: Diagnosis not present

## 2020-10-24 DIAGNOSIS — G894 Chronic pain syndrome: Secondary | ICD-10-CM | POA: Diagnosis not present

## 2020-10-24 DIAGNOSIS — M62830 Muscle spasm of back: Secondary | ICD-10-CM | POA: Diagnosis not present

## 2020-10-24 DIAGNOSIS — M546 Pain in thoracic spine: Secondary | ICD-10-CM | POA: Diagnosis not present

## 2020-10-24 DIAGNOSIS — M4306 Spondylolysis, lumbar region: Secondary | ICD-10-CM | POA: Diagnosis not present

## 2020-10-28 DIAGNOSIS — Z23 Encounter for immunization: Secondary | ICD-10-CM | POA: Diagnosis not present

## 2020-10-29 DIAGNOSIS — M62830 Muscle spasm of back: Secondary | ICD-10-CM | POA: Diagnosis not present

## 2020-10-29 DIAGNOSIS — M546 Pain in thoracic spine: Secondary | ICD-10-CM | POA: Diagnosis not present

## 2020-10-29 DIAGNOSIS — M4306 Spondylolysis, lumbar region: Secondary | ICD-10-CM | POA: Diagnosis not present

## 2020-10-29 DIAGNOSIS — G894 Chronic pain syndrome: Secondary | ICD-10-CM | POA: Diagnosis not present

## 2020-11-05 DIAGNOSIS — M47816 Spondylosis without myelopathy or radiculopathy, lumbar region: Secondary | ICD-10-CM | POA: Diagnosis not present

## 2020-11-05 DIAGNOSIS — M4306 Spondylolysis, lumbar region: Secondary | ICD-10-CM | POA: Diagnosis not present

## 2020-11-07 DIAGNOSIS — M4306 Spondylolysis, lumbar region: Secondary | ICD-10-CM | POA: Diagnosis not present

## 2020-11-07 DIAGNOSIS — M546 Pain in thoracic spine: Secondary | ICD-10-CM | POA: Diagnosis not present

## 2020-11-07 DIAGNOSIS — M62830 Muscle spasm of back: Secondary | ICD-10-CM | POA: Diagnosis not present

## 2020-11-07 DIAGNOSIS — G894 Chronic pain syndrome: Secondary | ICD-10-CM | POA: Diagnosis not present

## 2020-11-10 DIAGNOSIS — M5416 Radiculopathy, lumbar region: Secondary | ICD-10-CM | POA: Diagnosis not present

## 2020-11-10 DIAGNOSIS — M4316 Spondylolisthesis, lumbar region: Secondary | ICD-10-CM | POA: Diagnosis not present

## 2020-11-10 DIAGNOSIS — G894 Chronic pain syndrome: Secondary | ICD-10-CM | POA: Diagnosis not present

## 2020-11-10 DIAGNOSIS — M4306 Spondylolysis, lumbar region: Secondary | ICD-10-CM | POA: Diagnosis not present

## 2020-11-12 DIAGNOSIS — G894 Chronic pain syndrome: Secondary | ICD-10-CM | POA: Diagnosis not present

## 2020-11-12 DIAGNOSIS — M62830 Muscle spasm of back: Secondary | ICD-10-CM | POA: Diagnosis not present

## 2020-11-12 DIAGNOSIS — M546 Pain in thoracic spine: Secondary | ICD-10-CM | POA: Diagnosis not present

## 2020-11-12 DIAGNOSIS — M4306 Spondylolysis, lumbar region: Secondary | ICD-10-CM | POA: Diagnosis not present

## 2020-11-14 DIAGNOSIS — M4306 Spondylolysis, lumbar region: Secondary | ICD-10-CM | POA: Diagnosis not present

## 2020-11-14 DIAGNOSIS — G894 Chronic pain syndrome: Secondary | ICD-10-CM | POA: Diagnosis not present

## 2020-11-14 DIAGNOSIS — M62830 Muscle spasm of back: Secondary | ICD-10-CM | POA: Diagnosis not present

## 2020-11-14 DIAGNOSIS — M546 Pain in thoracic spine: Secondary | ICD-10-CM | POA: Diagnosis not present

## 2020-11-16 DIAGNOSIS — E1065 Type 1 diabetes mellitus with hyperglycemia: Secondary | ICD-10-CM | POA: Diagnosis not present

## 2020-11-19 DIAGNOSIS — M4306 Spondylolysis, lumbar region: Secondary | ICD-10-CM | POA: Diagnosis not present

## 2020-11-19 DIAGNOSIS — M546 Pain in thoracic spine: Secondary | ICD-10-CM | POA: Diagnosis not present

## 2020-11-19 DIAGNOSIS — M62830 Muscle spasm of back: Secondary | ICD-10-CM | POA: Diagnosis not present

## 2020-11-19 DIAGNOSIS — G894 Chronic pain syndrome: Secondary | ICD-10-CM | POA: Diagnosis not present

## 2020-11-21 DIAGNOSIS — M4306 Spondylolysis, lumbar region: Secondary | ICD-10-CM | POA: Diagnosis not present

## 2020-11-21 DIAGNOSIS — G894 Chronic pain syndrome: Secondary | ICD-10-CM | POA: Diagnosis not present

## 2020-11-21 DIAGNOSIS — M546 Pain in thoracic spine: Secondary | ICD-10-CM | POA: Diagnosis not present

## 2020-11-21 DIAGNOSIS — M62830 Muscle spasm of back: Secondary | ICD-10-CM | POA: Diagnosis not present

## 2020-11-26 DIAGNOSIS — G894 Chronic pain syndrome: Secondary | ICD-10-CM | POA: Diagnosis not present

## 2020-11-26 DIAGNOSIS — M546 Pain in thoracic spine: Secondary | ICD-10-CM | POA: Diagnosis not present

## 2020-11-26 DIAGNOSIS — M62830 Muscle spasm of back: Secondary | ICD-10-CM | POA: Diagnosis not present

## 2020-11-26 DIAGNOSIS — M4306 Spondylolysis, lumbar region: Secondary | ICD-10-CM | POA: Diagnosis not present

## 2020-11-28 DIAGNOSIS — M546 Pain in thoracic spine: Secondary | ICD-10-CM | POA: Diagnosis not present

## 2020-11-28 DIAGNOSIS — M62830 Muscle spasm of back: Secondary | ICD-10-CM | POA: Diagnosis not present

## 2020-11-28 DIAGNOSIS — M4306 Spondylolysis, lumbar region: Secondary | ICD-10-CM | POA: Diagnosis not present

## 2020-11-28 DIAGNOSIS — G894 Chronic pain syndrome: Secondary | ICD-10-CM | POA: Diagnosis not present

## 2020-12-03 DIAGNOSIS — M62830 Muscle spasm of back: Secondary | ICD-10-CM | POA: Diagnosis not present

## 2020-12-03 DIAGNOSIS — M546 Pain in thoracic spine: Secondary | ICD-10-CM | POA: Diagnosis not present

## 2020-12-03 DIAGNOSIS — M4306 Spondylolysis, lumbar region: Secondary | ICD-10-CM | POA: Diagnosis not present

## 2020-12-03 DIAGNOSIS — G894 Chronic pain syndrome: Secondary | ICD-10-CM | POA: Diagnosis not present

## 2020-12-05 DIAGNOSIS — G894 Chronic pain syndrome: Secondary | ICD-10-CM | POA: Diagnosis not present

## 2020-12-05 DIAGNOSIS — M546 Pain in thoracic spine: Secondary | ICD-10-CM | POA: Diagnosis not present

## 2020-12-05 DIAGNOSIS — M62830 Muscle spasm of back: Secondary | ICD-10-CM | POA: Diagnosis not present

## 2020-12-05 DIAGNOSIS — M4306 Spondylolysis, lumbar region: Secondary | ICD-10-CM | POA: Diagnosis not present

## 2020-12-10 DIAGNOSIS — M4316 Spondylolisthesis, lumbar region: Secondary | ICD-10-CM | POA: Diagnosis not present

## 2020-12-10 DIAGNOSIS — M5416 Radiculopathy, lumbar region: Secondary | ICD-10-CM | POA: Diagnosis not present

## 2020-12-12 DIAGNOSIS — G894 Chronic pain syndrome: Secondary | ICD-10-CM | POA: Diagnosis not present

## 2020-12-12 DIAGNOSIS — M546 Pain in thoracic spine: Secondary | ICD-10-CM | POA: Diagnosis not present

## 2020-12-12 DIAGNOSIS — M62830 Muscle spasm of back: Secondary | ICD-10-CM | POA: Diagnosis not present

## 2020-12-12 DIAGNOSIS — M4306 Spondylolysis, lumbar region: Secondary | ICD-10-CM | POA: Diagnosis not present

## 2020-12-19 DIAGNOSIS — M62830 Muscle spasm of back: Secondary | ICD-10-CM | POA: Diagnosis not present

## 2020-12-19 DIAGNOSIS — M546 Pain in thoracic spine: Secondary | ICD-10-CM | POA: Diagnosis not present

## 2020-12-19 DIAGNOSIS — M4306 Spondylolysis, lumbar region: Secondary | ICD-10-CM | POA: Diagnosis not present

## 2020-12-19 DIAGNOSIS — G894 Chronic pain syndrome: Secondary | ICD-10-CM | POA: Diagnosis not present

## 2020-12-22 DIAGNOSIS — G894 Chronic pain syndrome: Secondary | ICD-10-CM | POA: Diagnosis not present

## 2020-12-22 DIAGNOSIS — M62830 Muscle spasm of back: Secondary | ICD-10-CM | POA: Diagnosis not present

## 2020-12-22 DIAGNOSIS — M546 Pain in thoracic spine: Secondary | ICD-10-CM | POA: Diagnosis not present

## 2020-12-22 DIAGNOSIS — M5416 Radiculopathy, lumbar region: Secondary | ICD-10-CM | POA: Diagnosis not present

## 2020-12-23 DIAGNOSIS — E109 Type 1 diabetes mellitus without complications: Secondary | ICD-10-CM | POA: Diagnosis not present

## 2020-12-23 DIAGNOSIS — R110 Nausea: Secondary | ICD-10-CM | POA: Diagnosis not present

## 2020-12-23 DIAGNOSIS — I10 Essential (primary) hypertension: Secondary | ICD-10-CM | POA: Diagnosis not present

## 2020-12-23 DIAGNOSIS — M519 Unspecified thoracic, thoracolumbar and lumbosacral intervertebral disc disorder: Secondary | ICD-10-CM | POA: Diagnosis not present

## 2020-12-24 DIAGNOSIS — M546 Pain in thoracic spine: Secondary | ICD-10-CM | POA: Diagnosis not present

## 2020-12-24 DIAGNOSIS — M4306 Spondylolysis, lumbar region: Secondary | ICD-10-CM | POA: Diagnosis not present

## 2020-12-24 DIAGNOSIS — G894 Chronic pain syndrome: Secondary | ICD-10-CM | POA: Diagnosis not present

## 2020-12-24 DIAGNOSIS — M62830 Muscle spasm of back: Secondary | ICD-10-CM | POA: Diagnosis not present

## 2020-12-26 DIAGNOSIS — G894 Chronic pain syndrome: Secondary | ICD-10-CM | POA: Diagnosis not present

## 2020-12-26 DIAGNOSIS — M25511 Pain in right shoulder: Secondary | ICD-10-CM | POA: Diagnosis not present

## 2020-12-26 DIAGNOSIS — M546 Pain in thoracic spine: Secondary | ICD-10-CM | POA: Diagnosis not present

## 2020-12-26 DIAGNOSIS — M62830 Muscle spasm of back: Secondary | ICD-10-CM | POA: Diagnosis not present

## 2020-12-26 DIAGNOSIS — M4306 Spondylolysis, lumbar region: Secondary | ICD-10-CM | POA: Diagnosis not present

## 2021-01-02 DIAGNOSIS — G894 Chronic pain syndrome: Secondary | ICD-10-CM | POA: Diagnosis not present

## 2021-01-02 DIAGNOSIS — M546 Pain in thoracic spine: Secondary | ICD-10-CM | POA: Diagnosis not present

## 2021-01-02 DIAGNOSIS — M4306 Spondylolysis, lumbar region: Secondary | ICD-10-CM | POA: Diagnosis not present

## 2021-01-02 DIAGNOSIS — M62830 Muscle spasm of back: Secondary | ICD-10-CM | POA: Diagnosis not present

## 2021-01-07 DIAGNOSIS — G894 Chronic pain syndrome: Secondary | ICD-10-CM | POA: Diagnosis not present

## 2021-01-07 DIAGNOSIS — M546 Pain in thoracic spine: Secondary | ICD-10-CM | POA: Diagnosis not present

## 2021-01-07 DIAGNOSIS — M4306 Spondylolysis, lumbar region: Secondary | ICD-10-CM | POA: Diagnosis not present

## 2021-01-07 DIAGNOSIS — M62830 Muscle spasm of back: Secondary | ICD-10-CM | POA: Diagnosis not present

## 2021-01-09 DIAGNOSIS — M62830 Muscle spasm of back: Secondary | ICD-10-CM | POA: Diagnosis not present

## 2021-01-09 DIAGNOSIS — R110 Nausea: Secondary | ICD-10-CM | POA: Diagnosis not present

## 2021-01-09 DIAGNOSIS — M4306 Spondylolysis, lumbar region: Secondary | ICD-10-CM | POA: Diagnosis not present

## 2021-01-09 DIAGNOSIS — M546 Pain in thoracic spine: Secondary | ICD-10-CM | POA: Diagnosis not present

## 2021-01-09 DIAGNOSIS — G894 Chronic pain syndrome: Secondary | ICD-10-CM | POA: Diagnosis not present

## 2021-01-14 DIAGNOSIS — M62830 Muscle spasm of back: Secondary | ICD-10-CM | POA: Diagnosis not present

## 2021-01-14 DIAGNOSIS — M4306 Spondylolysis, lumbar region: Secondary | ICD-10-CM | POA: Diagnosis not present

## 2021-01-14 DIAGNOSIS — M546 Pain in thoracic spine: Secondary | ICD-10-CM | POA: Diagnosis not present

## 2021-01-14 DIAGNOSIS — G894 Chronic pain syndrome: Secondary | ICD-10-CM | POA: Diagnosis not present

## 2021-01-15 DIAGNOSIS — M546 Pain in thoracic spine: Secondary | ICD-10-CM | POA: Diagnosis not present

## 2021-01-15 DIAGNOSIS — G894 Chronic pain syndrome: Secondary | ICD-10-CM | POA: Diagnosis not present

## 2021-01-15 DIAGNOSIS — M62830 Muscle spasm of back: Secondary | ICD-10-CM | POA: Diagnosis not present

## 2021-01-15 DIAGNOSIS — M4306 Spondylolysis, lumbar region: Secondary | ICD-10-CM | POA: Diagnosis not present

## 2021-02-12 DIAGNOSIS — M546 Pain in thoracic spine: Secondary | ICD-10-CM | POA: Diagnosis not present

## 2021-02-12 DIAGNOSIS — M62830 Muscle spasm of back: Secondary | ICD-10-CM | POA: Diagnosis not present

## 2021-02-12 DIAGNOSIS — M5416 Radiculopathy, lumbar region: Secondary | ICD-10-CM | POA: Diagnosis not present

## 2021-02-12 DIAGNOSIS — G894 Chronic pain syndrome: Secondary | ICD-10-CM | POA: Diagnosis not present

## 2021-02-12 DIAGNOSIS — E1065 Type 1 diabetes mellitus with hyperglycemia: Secondary | ICD-10-CM | POA: Diagnosis not present

## 2021-02-25 DIAGNOSIS — M62830 Muscle spasm of back: Secondary | ICD-10-CM | POA: Diagnosis not present

## 2021-02-25 DIAGNOSIS — F1120 Opioid dependence, uncomplicated: Secondary | ICD-10-CM | POA: Diagnosis not present

## 2021-02-25 DIAGNOSIS — G894 Chronic pain syndrome: Secondary | ICD-10-CM | POA: Diagnosis not present

## 2021-02-25 DIAGNOSIS — M546 Pain in thoracic spine: Secondary | ICD-10-CM | POA: Diagnosis not present

## 2021-03-11 DIAGNOSIS — M4316 Spondylolisthesis, lumbar region: Secondary | ICD-10-CM | POA: Diagnosis not present

## 2021-03-11 DIAGNOSIS — M47896 Other spondylosis, lumbar region: Secondary | ICD-10-CM | POA: Diagnosis not present

## 2021-03-18 DIAGNOSIS — M4312 Spondylolisthesis, cervical region: Secondary | ICD-10-CM | POA: Diagnosis not present

## 2021-03-18 DIAGNOSIS — M47896 Other spondylosis, lumbar region: Secondary | ICD-10-CM | POA: Diagnosis not present

## 2021-03-26 DIAGNOSIS — R209 Unspecified disturbances of skin sensation: Secondary | ICD-10-CM | POA: Diagnosis not present

## 2021-03-26 DIAGNOSIS — M792 Neuralgia and neuritis, unspecified: Secondary | ICD-10-CM | POA: Diagnosis not present

## 2021-04-07 DIAGNOSIS — E103293 Type 1 diabetes mellitus with mild nonproliferative diabetic retinopathy without macular edema, bilateral: Secondary | ICD-10-CM | POA: Diagnosis not present

## 2021-04-07 DIAGNOSIS — H35373 Puckering of macula, bilateral: Secondary | ICD-10-CM | POA: Diagnosis not present

## 2021-04-07 DIAGNOSIS — E103211 Type 1 diabetes mellitus with mild nonproliferative diabetic retinopathy with macular edema, right eye: Secondary | ICD-10-CM | POA: Diagnosis not present

## 2021-04-16 DIAGNOSIS — F1120 Opioid dependence, uncomplicated: Secondary | ICD-10-CM | POA: Diagnosis not present

## 2021-04-16 DIAGNOSIS — G894 Chronic pain syndrome: Secondary | ICD-10-CM | POA: Diagnosis not present

## 2021-04-16 DIAGNOSIS — M546 Pain in thoracic spine: Secondary | ICD-10-CM | POA: Diagnosis not present

## 2021-04-16 DIAGNOSIS — M47896 Other spondylosis, lumbar region: Secondary | ICD-10-CM | POA: Diagnosis not present

## 2021-04-21 DIAGNOSIS — M47896 Other spondylosis, lumbar region: Secondary | ICD-10-CM | POA: Diagnosis not present

## 2021-04-21 DIAGNOSIS — G894 Chronic pain syndrome: Secondary | ICD-10-CM | POA: Diagnosis not present

## 2021-04-21 DIAGNOSIS — F1120 Opioid dependence, uncomplicated: Secondary | ICD-10-CM | POA: Diagnosis not present

## 2021-04-21 DIAGNOSIS — M546 Pain in thoracic spine: Secondary | ICD-10-CM | POA: Diagnosis not present

## 2021-05-04 DIAGNOSIS — Z6841 Body Mass Index (BMI) 40.0 and over, adult: Secondary | ICD-10-CM | POA: Diagnosis not present

## 2021-05-04 DIAGNOSIS — Z01419 Encounter for gynecological examination (general) (routine) without abnormal findings: Secondary | ICD-10-CM | POA: Diagnosis not present

## 2021-05-07 DIAGNOSIS — E1065 Type 1 diabetes mellitus with hyperglycemia: Secondary | ICD-10-CM | POA: Diagnosis not present

## 2021-05-11 DIAGNOSIS — F1120 Opioid dependence, uncomplicated: Secondary | ICD-10-CM | POA: Diagnosis not present

## 2021-05-11 DIAGNOSIS — M546 Pain in thoracic spine: Secondary | ICD-10-CM | POA: Diagnosis not present

## 2021-05-11 DIAGNOSIS — G894 Chronic pain syndrome: Secondary | ICD-10-CM | POA: Diagnosis not present

## 2021-05-11 DIAGNOSIS — M47896 Other spondylosis, lumbar region: Secondary | ICD-10-CM | POA: Diagnosis not present

## 2021-06-09 DIAGNOSIS — M546 Pain in thoracic spine: Secondary | ICD-10-CM | POA: Diagnosis not present

## 2021-06-09 DIAGNOSIS — M47896 Other spondylosis, lumbar region: Secondary | ICD-10-CM | POA: Diagnosis not present

## 2021-06-09 DIAGNOSIS — G894 Chronic pain syndrome: Secondary | ICD-10-CM | POA: Diagnosis not present

## 2021-06-09 DIAGNOSIS — F1120 Opioid dependence, uncomplicated: Secondary | ICD-10-CM | POA: Diagnosis not present

## 2021-07-09 DIAGNOSIS — M47896 Other spondylosis, lumbar region: Secondary | ICD-10-CM | POA: Diagnosis not present

## 2021-07-09 DIAGNOSIS — M546 Pain in thoracic spine: Secondary | ICD-10-CM | POA: Diagnosis not present

## 2021-07-09 DIAGNOSIS — G894 Chronic pain syndrome: Secondary | ICD-10-CM | POA: Diagnosis not present

## 2021-07-09 DIAGNOSIS — F1120 Opioid dependence, uncomplicated: Secondary | ICD-10-CM | POA: Diagnosis not present
